# Patient Record
Sex: Male | Born: 1955 | Hispanic: No | Smoking: Former smoker
Health system: Southern US, Community
[De-identification: ages and names within clinical notes are randomized; demographics above are authoritative.]

## PROBLEM LIST (undated history)

## (undated) DIAGNOSIS — S83281A Other tear of lateral meniscus, current injury, right knee, initial encounter: Secondary | ICD-10-CM

## (undated) DIAGNOSIS — J45909 Unspecified asthma, uncomplicated: Secondary | ICD-10-CM

## (undated) DIAGNOSIS — R011 Cardiac murmur, unspecified: Secondary | ICD-10-CM

## (undated) DIAGNOSIS — J40 Bronchitis, not specified as acute or chronic: Secondary | ICD-10-CM

## (undated) HISTORY — DX: Cardiac murmur, unspecified: R01.1

## (undated) HISTORY — DX: Unspecified asthma, uncomplicated: J45.909

## (undated) HISTORY — PX: TUMOR REMOVAL: SHX12

## (undated) HISTORY — DX: Bronchitis, not specified as acute or chronic: J40

## (undated) HISTORY — DX: Other tear of lateral meniscus, current injury, right knee, initial encounter: S83.281A

---

## 2020-10-22 ENCOUNTER — Encounter (INDEPENDENT_AMBULATORY_CARE_PROVIDER_SITE_OTHER): Payer: Self-pay

## 2020-10-22 ENCOUNTER — Other Ambulatory Visit: Payer: Self-pay

## 2020-10-22 ENCOUNTER — Other Ambulatory Visit: Payer: Self-pay | Admitting: Internal Medicine

## 2020-10-22 ENCOUNTER — Telehealth: Payer: Self-pay | Admitting: Internal Medicine

## 2020-10-22 ENCOUNTER — Ambulatory Visit (INDEPENDENT_AMBULATORY_CARE_PROVIDER_SITE_OTHER): Payer: Managed Care, Other (non HMO)

## 2020-10-22 ENCOUNTER — Encounter: Payer: Self-pay | Admitting: Internal Medicine

## 2020-10-22 ENCOUNTER — Ambulatory Visit (INDEPENDENT_AMBULATORY_CARE_PROVIDER_SITE_OTHER): Payer: Managed Care, Other (non HMO) | Admitting: Internal Medicine

## 2020-10-22 VITALS — BP 128/86 | HR 69 | Temp 98.2°F | Ht 72.0 in | Wt 201.0 lb

## 2020-10-22 DIAGNOSIS — J418 Mixed simple and mucopurulent chronic bronchitis: Secondary | ICD-10-CM

## 2020-10-22 DIAGNOSIS — Z720 Tobacco use: Secondary | ICD-10-CM | POA: Insufficient documentation

## 2020-10-22 DIAGNOSIS — Z23 Encounter for immunization: Secondary | ICD-10-CM | POA: Diagnosis not present

## 2020-10-22 DIAGNOSIS — N5201 Erectile dysfunction due to arterial insufficiency: Secondary | ICD-10-CM

## 2020-10-22 DIAGNOSIS — M5412 Radiculopathy, cervical region: Secondary | ICD-10-CM

## 2020-10-22 DIAGNOSIS — R059 Cough, unspecified: Secondary | ICD-10-CM | POA: Diagnosis not present

## 2020-10-22 DIAGNOSIS — R0602 Shortness of breath: Secondary | ICD-10-CM

## 2020-10-22 DIAGNOSIS — M4802 Spinal stenosis, cervical region: Secondary | ICD-10-CM

## 2020-10-22 DIAGNOSIS — R972 Elevated prostate specific antigen [PSA]: Secondary | ICD-10-CM

## 2020-10-22 LAB — HEPATIC FUNCTION PANEL
ALT: 17 U/L (ref 0–53)
AST: 21 U/L (ref 0–37)
Albumin: 4.8 g/dL (ref 3.5–5.2)
Alkaline Phosphatase: 89 U/L (ref 39–117)
Bilirubin, Direct: 0.1 mg/dL (ref 0.0–0.3)
Total Bilirubin: 0.7 mg/dL (ref 0.2–1.2)
Total Protein: 7.9 g/dL (ref 6.0–8.3)

## 2020-10-22 LAB — LIPID PANEL
Cholesterol: 186 mg/dL (ref 0–200)
HDL: 57.6 mg/dL (ref >39.00)
LDL Cholesterol: 111 mg/dL — ABNORMAL HIGH (ref 0–99)
NonHDL: 128.79
Total CHOL/HDL Ratio: 3
Triglycerides: 89 mg/dL (ref 0.0–149.0)
VLDL: 17.8 mg/dL (ref 0.0–40.0)

## 2020-10-22 LAB — TESTOSTERONE TOTAL,FREE,BIO, MALES
Albumin: 4.7 g/dL (ref 3.6–5.1)
Sex Hormone Binding: 64 nmol/L (ref 22–77)
Testosterone, Bioavailable: 83.9 ng/dL — ABNORMAL LOW (ref 110.0–575.0)
Testosterone, Free: 39.1 pg/mL — ABNORMAL LOW (ref 46.0–224.0)
Testosterone: 525 ng/dL (ref 250–827)

## 2020-10-22 LAB — TSH: TSH: 0.49 u[IU]/mL (ref 0.35–5.50)

## 2020-10-22 LAB — CBC WITH DIFFERENTIAL/PLATELET
Basophils Absolute: 0.1 10*3/uL (ref 0.0–0.1)
Basophils Relative: 1.1 % (ref 0.0–3.0)
Eosinophils Absolute: 0.3 10*3/uL (ref 0.0–0.7)
Eosinophils Relative: 4.1 % (ref 0.0–5.0)
HCT: 45.3 % (ref 39.0–52.0)
Hemoglobin: 14.9 g/dL (ref 13.0–17.0)
Lymphocytes Relative: 35.7 % (ref 12.0–46.0)
Lymphs Abs: 2.6 10*3/uL (ref 0.7–4.0)
MCHC: 32.9 g/dL (ref 30.0–36.0)
MCV: 95.5 fl (ref 78.0–100.0)
Monocytes Absolute: 0.7 10*3/uL (ref 0.1–1.0)
Monocytes Relative: 10.3 % (ref 3.0–12.0)
Neutro Abs: 3.5 10*3/uL (ref 1.4–7.7)
Neutrophils Relative %: 48.8 % (ref 43.0–77.0)
Platelets: 258 10*3/uL (ref 150.0–400.0)
RBC: 4.75 Mil/uL (ref 4.22–5.81)
RDW: 13.5 % (ref 11.5–15.5)
WBC: 7.2 10*3/uL (ref 4.0–10.5)

## 2020-10-22 LAB — PSA: PSA: 7.37 ng/mL — ABNORMAL HIGH (ref 0.10–4.00)

## 2020-10-22 LAB — BASIC METABOLIC PANEL
BUN: 16 mg/dL (ref 6–23)
CO2: 27 mEq/L (ref 19–32)
Calcium: 9.9 mg/dL (ref 8.4–10.5)
Chloride: 103 mEq/L (ref 96–112)
Creatinine, Ser: 1.17 mg/dL (ref 0.40–1.50)
GFR: 65.65 mL/min (ref >60.00)
Glucose, Bld: 85 mg/dL (ref 70–99)
Potassium: 4.2 mEq/L (ref 3.5–5.1)
Sodium: 139 mEq/L (ref 135–145)

## 2020-10-22 MED ORDER — VARDENAFIL HCL 20 MG PO TABS: 20.0000 mg | ORAL_TABLET | Freq: Every day | ORAL | 2 refills | Status: DC | PRN

## 2020-10-22 MED ORDER — ALBUTEROL SULFATE HFA 108 (90 BASE) MCG/ACT IN AERS: 2.0000 | INHALATION_SPRAY | Freq: Four times a day (QID) | RESPIRATORY_TRACT | 3 refills | Status: DC | PRN

## 2020-10-22 MED ORDER — UMECLIDINIUM-VILANTEROL 62.5-25 MCG/INH IN AEPB: 1.0000 | INHALATION_SPRAY | Freq: Every day | RESPIRATORY_TRACT | 1 refills | Status: DC

## 2020-10-22 MED ORDER — TRELEGY ELLIPTA 100-62.5-25 MCG/INH IN AEPB: 1.0000 | INHALATION_SPRAY | Freq: Every day | RESPIRATORY_TRACT | 1 refills | Status: DC

## 2020-10-22 MED ORDER — STIOLTO RESPIMAT 2.5-2.5 MCG/ACT IN AERS: 2.0000 | INHALATION_SPRAY | Freq: Every day | RESPIRATORY_TRACT | 1 refills | Status: DC

## 2020-10-22 MED ORDER — BREZTRI AEROSPHERE 160-9-4.8 MCG/ACT IN AERO: 2.0000 | INHALATION_SPRAY | Freq: Two times a day (BID) | RESPIRATORY_TRACT | 1 refills | Status: DC

## 2020-10-22 NOTE — Telephone Encounter (Signed)
   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

## 2020-10-22 NOTE — Patient Instructions (Signed)

## 2020-10-22 NOTE — Progress Notes (Signed)
[facility]  Subjective:  Patient ID: Tyler Zuniga, male    DOB: 1955/05/16  Age: 65 y.o. MRN: [id number]  CC: Cough  This visit occurred during the SARS-CoV-2 public health emergency.  Safety protocols were in place, including screening questions prior to the visit, additional usage of staff PPE, and extensive cleaning of exam room while observing appropriate contact time as indicated for disinfecting solutions.    HPI Tyler Zuniga presents to establish.  He complains of a 5-week history of nontraumatic left-sided neck pain that radiates into his left upper extremity with left upper extremity numbness.  He denies weakness or incoordination.  He is controlling the pain with aspirin.  He has a chronic cough and shortness of breath.  He is using a family members asthma inhaler but he does not know what the inhaler is.  He gets significant symptom relief with the inhaler.  The cough is rarely productive of clear phlegm.  He denies hemoptysis, chest pain, weight loss, dyspnea on exertion, or dysphagia.  History Tyler Zuniga has a past medical history of Acute lateral meniscus tear of right knee, Asthma, Bronchitis, and Heart murmur.   He has a past surgical history that includes Tumor removal.   His family history includes Alcohol abuse in his father; Arthritis in his maternal grandmother and mother; COPD in his brother and father; Cancer in his maternal grandmother; Drug abuse in his brother; Heart attack in his maternal grandfather; Heart disease in his brother; Stroke in his mother.He reports that he has been smoking cigarettes. He has a 80.00 pack-year smoking history. He has never used smokeless tobacco. He reports that he does not drink alcohol and does not use drugs.  No outpatient medications prior to visit.   No facility-administered medications prior to visit.    ROS Review of Systems  Constitutional:  Negative for chills, diaphoresis, fatigue and fever.  HENT: Negative.     Respiratory:  Positive for cough and shortness of breath. Negative for chest tightness and wheezing.   Cardiovascular:  Negative for chest pain, palpitations and leg swelling.  Gastrointestinal:  Negative for abdominal pain, constipation, diarrhea, nausea and vomiting.  Genitourinary: Negative.  Negative for difficulty urinating and dysuria.       ++ED  Musculoskeletal:  Positive for neck pain. Negative for back pain and neck stiffness.  Skin: Negative.   Neurological:  Positive for numbness. Negative for dizziness, weakness and light-headedness.  Hematological:  Negative for adenopathy. Does not bruise/bleed easily.  Psychiatric/Behavioral: Negative.     Objective:  BP 128/86 (BP Location: Left Arm, Patient Position: Sitting, Cuff Size: Large)   Pulse 69   Temp 98.2 F (36.8 C) (Oral)   Ht 6' (1.829 m)   Wt 201 lb (91.2 kg)   SpO2 96%   BMI 27.26 kg/m   Physical Exam Vitals reviewed.  HENT:     Nose: Nose normal.     Mouth/Throat:     Mouth: Mucous membranes are moist.  Eyes:     Conjunctiva/sclera: Conjunctivae normal.  Cardiovascular:     Rate and Rhythm: Normal rate and regular rhythm.     Pulses:          Carotid pulses are 1+ on the right side and 1+ on the left side.      Radial pulses are 1+ on the right side and 1+ on the left side.       Femoral pulses are 1+ on the right side and 1+ on the left side.  Popliteal pulses are 1+ on the right side and 1+ on the left side.       Dorsalis pedis pulses are 1+ on the right side and 1+ on the left side.       Posterior tibial pulses are 1+ on the right side and 1+ on the left side.     Comments: EKG- NSR, 60 bpm Flat T wave in I No LVH or Q waves  Pulmonary:     Effort: Pulmonary effort is normal.     Breath sounds: No stridor. No wheezing, rhonchi or rales.  Abdominal:     General: Abdomen is flat.     Palpations: There is no mass.     Tenderness: There is no abdominal tenderness. There is no guarding.      Hernia: No hernia is present.  Musculoskeletal:        General: Normal range of motion.     Cervical back: Neck supple.     Right lower leg: No edema.     Left lower leg: No edema.  Lymphadenopathy:     Cervical: No cervical adenopathy.  Skin:    General: Skin is warm and dry.  Neurological:     General: No focal deficit present.     Mental Status: He is alert.  Psychiatric:        Mood and Affect: Mood normal.        Behavior: Behavior normal.   Lab Results  Component Value Date   WBC 7.2 10/22/2020   HGB 14.9 10/22/2020   HCT 45.3 10/22/2020   PLT 258.0 10/22/2020   GLUCOSE 85 10/22/2020   CHOL 186 10/22/2020   TRIG 89.0 10/22/2020   HDL 57.60 10/22/2020   LDLCALC 111 (H) 10/22/2020   ALT 17 10/22/2020   AST 21 10/22/2020   NA 139 10/22/2020   K 4.2 10/22/2020   CL 103 10/22/2020   CREATININE 1.17 10/22/2020   BUN 16 10/22/2020   CO2 27 10/22/2020   TSH 0.49 10/22/2020   PSA 7.37 (H) 10/22/2020    DG Chest 2 View  Result Date: 10/23/2020 CLINICAL DATA:  chronic cough, tobacco abuse EXAM: CHEST - 2 VIEW COMPARISON:  None. FINDINGS: The heart size and mediastinal contours are within normal limits. Tortuous aorta. Both lungs are clear. No visible pleural effusions or pneumothorax. No acute osseous abnormality. IMPRESSION: No active cardiopulmonary disease. Electronically Signed   By: Feliberto HartsFrederick S Jaydrian Corpening MD   On: 10/23/2020 08:05   DG Cervical Spine Complete  Result Date: 10/23/2020 CLINICAL DATA:  Left neck pain 5 weeks.  Left arm pain. EXAM: CERVICAL SPINE - COMPLETE 4+ VIEW COMPARISON:  None. FINDINGS: Normal alignment. No fracture or mass. Prevertebral soft tissues normal Mild disc degeneration and spurring throughout the cervical spine. Moderate right foraminal narrowing C3-4 and C4-5 due to spurring. Mild right foraminal narrowing C5-6. Mild left foraminal narrowing C3-4, C4-5, C5-6, C6-7 due to spurring. IMPRESSION: Cervical spondylosis with foraminal narrowing  bilaterally. No acute skeletal abnormality Electronically Signed   By: Marlan Palauharles  Clark M.D.   On: 10/23/2020 13:25     Assessment & Plan:   [name] was seen today for cough.  Diagnoses and all orders for this visit:  Cough- His chest x-ray is negative for mass or infiltrate. -     DG Chest 2 View; Future  Radiculitis of left cervical region -     DG Cervical Spine Complete; Future -     Ambulatory referral to Pain Clinic  Mixed  simple and mucopurulent chronic bronchitis (HCC)- I recommended that he start using a LAMA/LABA inhaler for this. -     Discontinue: Budeson-Glycopyrrol-Formoterol (BREZTRI AEROSPHERE) 160-9-4.8 MCG/ACT AERO; Inhale 2 puffs into the lungs in the morning and at bedtime. -     albuterol (VENTOLIN HFA) 108 (90 Base) MCG/ACT inhaler; Inhale 2 puffs into the lungs every 6 (six) hours as needed for wheezing or shortness of breath. -     CBC with Differential/Platelet; Future -     CBC with Differential/Platelet -     Discontinue: Fluticasone-Umeclidin-Vilant (TRELEGY ELLIPTA) 100-62.5-25 MCG/INH AEPB; Inhale 1 puff into the lungs daily. -     Discontinue: Tiotropium Bromide-Olodaterol (STIOLTO RESPIMAT) 2.5-2.5 MCG/ACT AERS; Inhale 2 puffs into the lungs daily. -     umeclidinium-vilanterol (ANORO ELLIPTA) 62.5-25 MCG/INH AEPB; Inhale 1 puff into the lungs daily at 6 (six) AM.  Tobacco abuse  SOB (shortness of breath)- I think this is related to the COPD.  His EKG and labs are reassuring. -     CBC with Differential/Platelet; Future -     Basic metabolic panel; Future -     Hepatic function panel; Future -     EKG 12-Lead -     Hepatic function panel -     Basic metabolic panel -     CBC with Differential/Platelet  Need for vaccination -     Pneumococcal conjugate vaccine 20-valent  Erectile dysfunction due to arterial insufficiency -     Lipid panel; Future -     Testosterone Total,Free,Bio, Males; Future -     TSH; Future -     PSA; Future -     PSA -      TSH -     Testosterone Total,Free,Bio, Males -     Lipid panel -     vardenafil (LEVITRA) 20 MG tablet; Take 1 tablet (20 mg total) by mouth daily as needed for erectile dysfunction.  PSA elevation -     Ambulatory referral to Urology  Foraminal stenosis of cervical region -     Ambulatory referral to Pain Clinic  I have discontinued Normand Sloop Delsanto's Breztri Aerosphere, Trelegy Ellipta, and Stiolto Respimat. I am also having him start on albuterol, umeclidinium-vilanterol, and vardenafil.  Meds ordered this encounter  Medications   DISCONTD: Budeson-Glycopyrrol-Formoterol (BREZTRI AEROSPHERE) 160-9-4.8 MCG/ACT AERO    Sig: Inhale 2 puffs into the lungs in the morning and at bedtime.    Dispense:  32.1 g    Refill:  1   albuterol (VENTOLIN HFA) 108 (90 Base) MCG/ACT inhaler    Sig: Inhale 2 puffs into the lungs every 6 (six) hours as needed for wheezing or shortness of breath.    Dispense:  18 g    Refill:  3   DISCONTD: Fluticasone-Umeclidin-Vilant (TRELEGY ELLIPTA) 100-62.5-25 MCG/INH AEPB    Sig: Inhale 1 puff into the lungs daily.    Dispense:  120 each    Refill:  1   DISCONTD: Tiotropium Bromide-Olodaterol (STIOLTO RESPIMAT) 2.5-2.5 MCG/ACT AERS    Sig: Inhale 2 puffs into the lungs daily.    Dispense:  12 g    Refill:  1   umeclidinium-vilanterol (ANORO ELLIPTA) 62.5-25 MCG/INH AEPB    Sig: Inhale 1 puff into the lungs daily at 6 (six) AM.    Dispense:  120 each    Refill:  1   vardenafil (LEVITRA) 20 MG tablet    Sig: Take 1 tablet (20 mg total) by mouth daily as  needed for erectile dysfunction.    Dispense:  5 tablet    Refill:  2   DG Chest 2 View  Result Date: 10/23/2020 CLINICAL DATA:  chronic cough, tobacco abuse EXAM: CHEST - 2 VIEW COMPARISON:  None. FINDINGS: The heart size and mediastinal contours are within normal limits. Tortuous aorta. Both lungs are clear. No visible pleural effusions or pneumothorax. No acute osseous abnormality. IMPRESSION: No  active cardiopulmonary disease. Electronically Signed   By: Feliberto Harts MD   On: 10/23/2020 08:05   DG Cervical Spine Complete  Result Date: 10/23/2020 CLINICAL DATA:  Left neck pain 5 weeks.  Left arm pain. EXAM: CERVICAL SPINE - COMPLETE 4+ VIEW COMPARISON:  None. FINDINGS: Normal alignment. No fracture or mass. Prevertebral soft tissues normal Mild disc degeneration and spurring throughout the cervical spine. Moderate right foraminal narrowing C3-4 and C4-5 due to spurring. Mild right foraminal narrowing C5-6. Mild left foraminal narrowing C3-4, C4-5, C5-6, C6-7 due to spurring. IMPRESSION: Cervical spondylosis with foraminal narrowing bilaterally. No acute skeletal abnormality Electronically Signed   By: Marlan Palau M.D.   On: 10/23/2020 13:25      Follow-up: Return in about 4 weeks (around 11/19/2020).  Sanda Linger, MD

## 2020-10-23 ENCOUNTER — Encounter: Payer: Self-pay | Admitting: Internal Medicine

## 2020-10-23 DIAGNOSIS — M4802 Spinal stenosis, cervical region: Secondary | ICD-10-CM | POA: Insufficient documentation

## 2020-10-23 DIAGNOSIS — Z23 Encounter for immunization: Secondary | ICD-10-CM | POA: Insufficient documentation

## 2020-11-12 ENCOUNTER — Encounter: Payer: Self-pay | Admitting: Physical Medicine and Rehabilitation

## 2020-12-01 ENCOUNTER — Other Ambulatory Visit: Payer: Self-pay

## 2020-12-02 ENCOUNTER — Ambulatory Visit (INDEPENDENT_AMBULATORY_CARE_PROVIDER_SITE_OTHER): Payer: Managed Care, Other (non HMO) | Admitting: Internal Medicine

## 2020-12-02 ENCOUNTER — Encounter: Payer: Self-pay | Admitting: Internal Medicine

## 2020-12-02 VITALS — BP 132/82 | HR 65 | Temp 98.6°F | Resp 16 | Ht 72.0 in | Wt 208.0 lb

## 2020-12-02 DIAGNOSIS — Z23 Encounter for immunization: Secondary | ICD-10-CM | POA: Diagnosis not present

## 2020-12-02 DIAGNOSIS — N5201 Erectile dysfunction due to arterial insufficiency: Secondary | ICD-10-CM | POA: Diagnosis not present

## 2020-12-02 DIAGNOSIS — J418 Mixed simple and mucopurulent chronic bronchitis: Secondary | ICD-10-CM

## 2020-12-02 DIAGNOSIS — Z720 Tobacco use: Secondary | ICD-10-CM | POA: Diagnosis not present

## 2020-12-02 MED ORDER — VARDENAFIL HCL 20 MG PO TABS: 20.0000 mg | ORAL_TABLET | Freq: Every day | ORAL | 2 refills | Status: DC | PRN

## 2020-12-02 MED ORDER — UMECLIDINIUM-VILANTEROL 62.5-25 MCG/INH IN AEPB: 1.0000 | INHALATION_SPRAY | Freq: Every day | RESPIRATORY_TRACT | 1 refills | Status: DC

## 2020-12-02 NOTE — Progress Notes (Signed)
Subjective:  Patient ID: Tyler Zuniga, male    DOB: [birth year]  Age: 65 y.o. MRN: [id number]  CC: COPD  This visit occurred during the SARS-CoV-2 public health emergency.  Safety protocols were in place, including screening questions prior to the visit, additional usage of staff PPE, and extensive cleaning of exam room while observing appropriate contact time as indicated for disinfecting solutions.    HPI Charon Akamine presents for f/up -  His breathing has improved with the LAMA/LABA inhaler.  He wants me to send the prescription to his pharmacy benefit manager.  He denies chest pain, shortness of breath, wheezing, or cough.  Outpatient Medications Prior to Visit  Medication Sig Dispense Refill   albuterol (VENTOLIN HFA) 108 (90 Base) MCG/ACT inhaler Inhale 2 puffs into the lungs every 6 (six) hours as needed for wheezing or shortness of breath. 18 g 3   umeclidinium-vilanterol (ANORO ELLIPTA) 62.5-25 MCG/INH AEPB Inhale 1 puff into the lungs daily at 6 (six) AM. 120 each 1   vardenafil (LEVITRA) 20 MG tablet Take 1 tablet (20 mg total) by mouth daily as needed for erectile dysfunction. 5 tablet 2   No facility-administered medications prior to visit.    ROS Review of Systems  Constitutional:  Negative for diaphoresis and fatigue.  HENT: Negative.    Eyes: Negative.   Respiratory: Negative.  Negative for cough, chest tightness, shortness of breath and wheezing.   Cardiovascular:  Negative for chest pain, palpitations and leg swelling.  Gastrointestinal:  Negative for abdominal pain, diarrhea, nausea and vomiting.  Endocrine: Negative.   Genitourinary: Negative.   Musculoskeletal:  Negative for arthralgias and myalgias.  Skin: Negative.   Neurological:  Negative for dizziness, weakness and light-headedness.  Hematological:  Negative for adenopathy. Does not bruise/bleed easily.  Psychiatric/Behavioral: Negative.     Objective:  BP 132/82 (BP Location: Right Arm, Patient  Position: Sitting, Cuff Size: Large)   Pulse 65   Temp 98.6 F (37 C) (Oral)   Resp 16   Ht 6' (1.829 m)   Wt 208 lb (94.3 kg)   SpO2 97%   BMI 28.21 kg/m   BP Readings from Last 3 Encounters:  12/02/20 132/82  10/22/20 128/86    Wt Readings from Last 3 Encounters:  12/02/20 208 lb (94.3 kg)  10/22/20 201 lb (91.2 kg)    Physical Exam Vitals reviewed.  HENT:     Nose: Nose normal.     Mouth/Throat:     Mouth: Mucous membranes are moist.  Eyes:     General: No scleral icterus.    Conjunctiva/sclera: Conjunctivae normal.  Cardiovascular:     Rate and Rhythm: Normal rate and regular rhythm.     Heart sounds: No murmur heard. Pulmonary:     Effort: Pulmonary effort is normal.     Breath sounds: No stridor. No wheezing, rhonchi or rales.  Abdominal:     General: Abdomen is flat. Bowel sounds are normal. There is no distension.     Palpations: Abdomen is soft. There is no hepatomegaly, splenomegaly or mass.     Tenderness: There is no abdominal tenderness.  Musculoskeletal:        General: Normal range of motion.     Cervical back: Neck supple.     Right lower leg: No edema.     Left lower leg: No edema.  Lymphadenopathy:     Cervical: No cervical adenopathy.  Skin:    General: Skin is warm and dry.  Neurological:  General: No focal deficit present.     Mental Status: He is alert.    Lab Results  Component Value Date   WBC 7.2 10/22/2020   HGB 14.9 10/22/2020   HCT 45.3 10/22/2020   PLT 258.0 10/22/2020   GLUCOSE 85 10/22/2020   CHOL 186 10/22/2020   TRIG 89.0 10/22/2020   HDL 57.60 10/22/2020   LDLCALC 111 (H) 10/22/2020   ALT 17 10/22/2020   AST 21 10/22/2020   NA 139 10/22/2020   K 4.2 10/22/2020   CL 103 10/22/2020   CREATININE 1.17 10/22/2020   BUN 16 10/22/2020   CO2 27 10/22/2020   TSH 0.49 10/22/2020   PSA 7.37 (H) 10/22/2020    Patient was never admitted.  Assessment & Plan:   Melissa was seen today for copd.  Diagnoses and all  orders for this visit:  Flu vaccine need -     Flu Vaccine QUAD High Dose(Fluad)  Mixed simple and mucopurulent chronic bronchitis (HCC)- Will continue the LAMA/LABA inhaler. -     umeclidinium-vilanterol (ANORO ELLIPTA) 62.5-25 MCG/INH AEPB; Inhale 1 puff into the lungs daily at 6 (six) AM.  Tobacco abuse -     Ambulatory Referral for Lung Cancer Scre  Erectile dysfunction due to arterial insufficiency -     vardenafil (LEVITRA) 20 MG tablet; Take 1 tablet (20 mg total) by mouth daily as needed for erectile dysfunction.  Other orders -     Tdap vaccine greater than or equal to 7yo IM  I am having Oluwatobi Visser maintain his albuterol, umeclidinium-vilanterol, and vardenafil.  Meds ordered this encounter  Medications   umeclidinium-vilanterol (ANORO ELLIPTA) 62.5-25 MCG/INH AEPB    Sig: Inhale 1 puff into the lungs daily at 6 (six) AM.    Dispense:  120 each    Refill:  1   vardenafil (LEVITRA) 20 MG tablet    Sig: Take 1 tablet (20 mg total) by mouth daily as needed for erectile dysfunction.    Dispense:  15 tablet    Refill:  2      Follow-up: Return in about 6 months (around 06/01/2021).  Sanda Linger, MD

## 2020-12-02 NOTE — Patient Instructions (Signed)
Chronic Obstructive Pulmonary Disease °Chronic obstructive pulmonary disease (COPD) is a long-term (chronic) condition that affects the lungs. COPD is a general term that can be used to describe many different lung problems that cause lung inflammation and limit airflow, including chronic bronchitis and emphysema. °If you have COPD, your lung function will probably never return to normal. In most cases, it gets worse over time. However, there are steps you can take to slow the progression of the disease and improve your quality of life. °What are the causes? °This condition may be caused by: °Smoking. This is the most common cause. °Certain genes passed down through families. °What increases the risk? °The following factors may make you more likely to develop this condition: °Being exposed to secondhand smoke from cigarettes, pipes, or cigars. °Being exposed to chemicals and other irritants, such as fumes and dust in the work environment. °Having chronic lung conditions or infections. °What are the signs or symptoms? °Symptoms of this condition include: °Shortness of breath, especially during physical activity. °Chronic cough with a large amount of thick mucus. Sometimes, the cough may not have any mucus (dry cough). °Wheezing and rapid breathing. °Gray or bluish discoloration (cyanosis) of the skin, especially in the fingers, toes, or lips. °Feeling tired (fatigue). °Weight loss. °Chest tightness. °Frequent infections. °Episodes when breathing symptoms become much worse (exacerbations). °At the later stages of this disease, you may have swelling in the ankles, feet, or legs. °How is this diagnosed? °This condition is diagnosed based on: °Your medical history. °A physical exam. °You may also have tests, including: °Lung (pulmonary) function tests. This may include a spirometry test, which measures your ability to exhale properly. °Chest X-ray. °CT scan. °Blood tests. °How is this treated? °This condition may be  treated with: °Medicines. These may include inhaled rescue medicines to treat acute exacerbations as well as medicines that you take long-term (maintenance medicines) to prevent flare-ups of COPD. °Bronchodilators help treat COPD by dilating the airways to allow increased airflow and make your breathing more comfortable. °Steroids can reduce airway inflammation and help prevent exacerbations. °Smoking cessation. If you smoke, your health care provider may ask you to quit, and may also recommend therapy or replacement products to help you quit. °Pulmonary rehabilitation. This may involve working with a team of health care providers and specialists, such as respiratory, occupational, and physical therapists. °Exercise and physical activity. These are beneficial for nearly all people with COPD. °Nutrition therapy to gain weight, if you are underweight. °Oxygen. Supplemental oxygen therapy is only helpful if you have a low oxygen level in your blood (hypoxemia). °Lung surgery or transplant. °Palliative care. This is to help people with COPD feel comfortable when treatment is no longer working. °Follow these instructions at home: °Medicines °Take over-the-counter and prescription medicines only as told by your health care provider. This includes inhaled medicines and pills. °Talk to your health care provider before taking any cough or allergy medicines. You may need to avoid certain medicines that dry out your airways. °Lifestyle °If you smoke, the most important thing that you can do is to stop smoking. Continuing to smoke will cause the disease to progress faster. °Do not use any products that contain nicotine or tobacco. These products include cigarettes, chewing tobacco, and vaping devices, such as e-cigarettes. If you need help quitting, ask your health care provider. °Avoid exposure to things that irritate your lungs, such as smoke, chemicals, and fumes. °Stay active, but balance activity with periods of rest.  Exercise and physical   activity will help you maintain your ability to do things you want to do. °Learn and use relaxation techniques to manage stress and to control your breathing. °Get the right amount of sleep and get quality sleep. Most adults need 7 or more hours per night. °Eat healthy foods. Eating smaller, more frequent meals and resting before meals may help you maintain your strength. °Controlled breathing °Learn and use controlled breathing techniques as directed by your health care provider. Controlled breathing techniques include: °Pursed lip breathing. Start by breathing in (inhaling) through your nose for 1 second. Then, purse your lips as if you were going to whistle and breathe out (exhale) through the pursed lips for 2 seconds. °Diaphragmatic breathing. Start by putting one hand on your abdomen just above your waist. Inhale slowly through your nose. The hand on your abdomen should move out. Then purse your lips and exhale slowly. You should be able to feel the hand on your abdomen moving in as you exhale. ° °Controlled coughing °Learn and use controlled coughing to clear mucus from your lungs. Controlled coughing is a series of short, progressive coughs. The steps of controlled coughing are: °Lean your head slightly forward. °Breathe in deeply using diaphragmatic breathing. °Try to hold your breath for 3 seconds. °Keep your mouth slightly open while coughing twice. °Spit any mucus out into a tissue. °Rest and repeat the steps once or twice as needed. °General instructions °Make sure you receive all the vaccines that your health care provider recommends, especially the pneumococcal and influenza vaccines. Preventing infection and hospitalization is very important when you have COPD. °Drink enough fluid to keep your urine pale yellow, unless you have a medical condition that requires fluid restriction. °Use oxygen therapy and pulmonary rehabilitation if told by your health care provider. If you  require home oxygen therapy, ask your health care provider whether you should purchase a pulse oximeter to measure your oxygen level at home. °Work with your health care provider to develop a COPD action plan. This will help you know what steps to take if your condition gets worse. °Keep other chronic health conditions under control as told by your health care provider. °Avoid extreme temperature and humidity changes. °Avoid contact with people who have an illness that spreads from person to person (is contagious), such as viral infections or pneumonia. °Keep all follow-up visits. This is important. °Contact a health care provider if: °You are coughing up more mucus than usual. °There is a change in the color or thickness of your mucus. °Your breathing is more labored than usual. °Your breathing is faster than usual. °You have difficulty sleeping. °You need to use your rescue medicines or inhalers more often than expected. °You have trouble doing routine activities such as getting dressed or walking around the house. °Get help right away if: °You have shortness of breath while you are resting. °You have shortness of breath that prevents you from: °Being able to talk. °Performing your usual physical activities. °You have chest pain lasting longer than 5 minutes. °Your skin color is more blue (cyanotic) than usual. °You measure low oxygen saturations for longer than 5 minutes with a pulse oximeter. °You have a fever. °You feel too tired to breathe normally. °These symptoms may represent a serious problem that is an emergency. Do not wait to see if the symptoms will go away. Get medical help right away. Call your local emergency services (911 in the [place] Do not drive yourself to the hospital. °Summary °Chronic obstructive pulmonary   disease (COPD) is a long-term (chronic) condition that affects the lungs. °Your lung function will probably never return to normal. In most cases, it gets worse over time. However, there  are steps you can take to slow the progression of the disease and improve your quality of life. °Treatment for COPD may include taking medicines, quitting smoking, pulmonary rehabilitation, and changes to diet and exercise. As the disease progresses, you may need oxygen therapy, a lung transplant, or palliative care. °To help manage your condition, do not smoke, avoid exposure to things that irritate your lungs, stay up to date on all vaccines, and follow your health care provider's instructions for taking medicines. °This information is not intended to replace advice given to you by your health care provider. Make sure you discuss any questions you have with your health care provider. °Document Revised: 01/07/2020 Document Reviewed: 01/07/2020 °Elsevier Patient Education © 2022 [employer] ° °

## 2020-12-10 ENCOUNTER — Telehealth: Payer: Self-pay | Admitting: Internal Medicine

## 2020-12-10 DIAGNOSIS — J418 Mixed simple and mucopurulent chronic bronchitis: Secondary | ICD-10-CM

## 2020-12-10 MED ORDER — ALBUTEROL SULFATE HFA 108 (90 BASE) MCG/ACT IN AERS: 2.0000 | INHALATION_SPRAY | Freq: Four times a day (QID) | RESPIRATORY_TRACT | 3 refills | Status: DC | PRN

## 2020-12-10 NOTE — Telephone Encounter (Signed)
1.Medication Requested: albuterol (VENTOLIN HFA) 108 (90 Base) MCG/ACT inhaler  2. Pharmacy (Name, Street, [place]): [facility] - [place], [place] - [address] AT [facility]  Phone:  [phone] Fax:  [phone]   3. On Med List: yes  4. Last Visit with PCP: 09.21.22  5. Next visit date with PCP: n/a  Patient says please discontinue his rx for  umeclidinium-vilanterol (ANORO ELLIPTA) 62.5-25 MCG/INH AEPB bc he says the inhaler makes him feel like he has something stuck in his throat & he doesn't like using it    Agent: Please be advised that RX refills may take up to 3 business days. We ask that you follow-up with your pharmacy.

## 2021-01-06 ENCOUNTER — Encounter
Payer: Managed Care, Other (non HMO) | Attending: Physical Medicine and Rehabilitation | Admitting: Physical Medicine and Rehabilitation

## 2021-01-06 ENCOUNTER — Other Ambulatory Visit: Payer: Self-pay

## 2021-01-06 ENCOUNTER — Encounter: Payer: Self-pay | Admitting: Physical Medicine and Rehabilitation

## 2021-01-06 VITALS — BP 162/95 | HR 67 | Ht 72.0 in | Wt 212.2 lb

## 2021-01-06 DIAGNOSIS — M4802 Spinal stenosis, cervical region: Secondary | ICD-10-CM | POA: Insufficient documentation

## 2021-01-06 DIAGNOSIS — I10 Essential (primary) hypertension: Secondary | ICD-10-CM | POA: Insufficient documentation

## 2021-01-06 MED ORDER — DULOXETINE HCL 20 MG PO CPEP: 20.0000 mg | ORAL_CAPSULE | Freq: Every day | ORAL | 3 refills | Status: DC

## 2021-01-06 NOTE — Addendum Note (Signed)
Addended by: Horton Chin on: 01/06/2021 03:34 PM   Modules accepted: Orders

## 2021-01-06 NOTE — Patient Instructions (Signed)
Foods that may reduce pain: 1) Ginger (especially studied for arthritis)- reduce leukotriene production to decrease inflammation 2) Blueberries- high in phytonutrients that decrease inflammation 3) Salmon- marine omega-3s reduce joint swelling and pain 4) Pumpkin seeds- reduce inflammation 5) dark chocolate- reduces inflammation 6) turmeric- reduces inflammation 7) tart cherries - reduce pain and stiffness 8) extra virgin olive oil - its compound olecanthal helps to block prostaglandins  9) chili peppers- can be eaten or applied topically via capsaicin 10) mint- helpful for headache, muscle aches, joint pain, and itching 11) garlic- reduces inflammation  Link to further information on diet for chronic pain: [email or web address]   HTN: -BP is 162/95 today.  -Advised checking BP daily at home and logging results to bring into follow-up appointment with her PCP and myself. -Reviewed BP meds today.  -Advised regarding healthy foods that can help lower blood pressure and provided with a list: 1) citrus foods- high in vitamins and minerals 2) salmon and other fatty fish - reduces inflammation and oxylipins 3) swiss chard (leafy green)- high level of nitrates 4) pumpkin seeds- one of the best natural sources of magnesium 5) Beans and lentils- high in fiber, magnesium, and potassium 6) Berries- high in flavonoids 7) Amaranth (whole grain, can be cooked similarly to rice and oats)- high in magnesium and fiber 8) Pistachios- even more effective at reducing BP than other nuts 9) Carrots- high in phenolic compounds that relax blood vessels and reduce inflammation 10) Celery- contain phthalides that relax tissues of arterial walls 11) Tomatoes- can also improve cholesterol and reduce risk of heart disease 12) Broccoli- good source of magnesium, calcium, and potassium 13) Greek yogurt: high in potassium and calcium 14) Herbs  and spices: Celery seed, cilantro, saffron, lemongrass, black cumin, ginseng, cinnamon, cardamom, sweet basil, and ginger 15) Chia and flax seeds- also help to lower cholesterol and blood sugar 16) Beets- high levels of nitrates that relax blood vessels  17) spinach and bananas- high in potassium  -Provided lise of supplements that can help with hypertension:  1) magnesium: one high quality brand is Bioptemizers since it contains all 7 types of magnesium, otherwise over the counter magnesium gluconate 400mg  is a good option 2) B vitamins 3) vitamin D 4) potassium 5) CoQ10 6) L-arginine 7) Vitamin C 8) Beetroot -Educated that goal BP is 120/80. -Made goal to incorporate some of the above foods into diet.

## 2021-01-06 NOTE — Progress Notes (Signed)
Subjective:    Patient ID: Tyler Zuniga, male    DOB: 23-Mar-1955, 65 y.o.   MRN: [id number]  HPI Tyler Zuniga is a 65 year old man who presents for cervical foraminal stenosis. -average pain is 7/10 -pain is burning, stabbing, tingling, and aching.  -started when he went on vacation -worse with movement -numbness extends down left arm -fingers never stop tingling.  -he has been using ibuprofen -his wife benefited from injection  BP today is 162/95   Pain Inventory Average Pain 7 Pain Right Now 7 My pain is burning, stabbing, tingling, and aching  In the last 24 hours, has pain interfered with the following? General activity 8 Relation with others 8 Enjoyment of life 10 What TIME of day is your pain at its worst? morning , daytime, evening, and night Sleep (in general) Poor  Pain is worse with: bending and some activites Pain improves with:  nothing improves pain , takes ibuprofen with no relief Relief from Meds: 0  walk without assistance ability to climb steps?  no do you drive?  no  employed # of hrs/week 70  weakness numbness tingling dizziness confusion depression anxiety  Any changes since last visit?  no  Any changes since last visit?  no    Family History  Problem Relation Age of Onset   Stroke Mother    Arthritis Mother    COPD Father    Alcohol abuse Father    Heart disease Brother    Drug abuse Brother    COPD Brother    Cancer Maternal Grandmother    Arthritis Maternal Grandmother    Heart attack Maternal Grandfather    Social History   Socioeconomic History   Marital status: Married    Spouse name: Not on file   Number of children: Not on file   Years of education: Not on file   Highest education level: Not on file  Occupational History   Not on file  Tobacco Use   Smoking status: Every Day    Packs/day: 2.00    Years: 40.00    Pack years: 80.00    Types: Cigarettes   Smokeless tobacco: Never  Vaping Use   Vaping Use:  Never used  Substance and Sexual Activity   Alcohol use: Never   Drug use: Never   Sexual activity: Yes    Partners: Female  Other Topics Concern   Not on file  Social History Narrative   Not on file   Social Determinants of Health   Financial Resource Strain: Not on file  Food Insecurity: Not on file  Transportation Needs: Not on file  Physical Activity: Not on file  Stress: Not on file  Social Connections: Not on file   Past Surgical History:  Procedure Laterality Date   TUMOR REMOVAL     Past Medical History:  Diagnosis Date   Acute lateral meniscus tear of right knee    Asthma    Bronchitis    Heart murmur    BP (!) 162/95   Pulse 67   Ht 6' (1.829 m)   Wt 212 lb 3.2 oz (96.3 kg)   SpO2 97%   BMI 28.78 kg/m   Opioid Risk Score:   Fall Risk Score:  `1  Depression screen PHQ 2/9  Depression screen [facility] 2/9 01/06/2021 10/22/2020  Decreased Interest 3 0  Down, Depressed, Hopeless 3 0  PHQ - 2 Score 6 0  Altered sleeping 3 -  Tired, decreased energy 3 -  Change in appetite 0 -  Feeling bad or failure about yourself  0 -  Trouble concentrating 3 -  Moving slowly or fidgety/restless 3 -  Suicidal thoughts 0 -  PHQ-9 Score 18 -     Review of Systems  Constitutional: Negative.   HENT: Negative.    Eyes: Negative.   Respiratory: Negative.    Cardiovascular: Negative.   Gastrointestinal: Negative.   Endocrine: Negative.   Genitourinary: Negative.   Musculoskeletal: Negative.   Skin: Negative.   Allergic/Immunologic: Negative.   Neurological:  Positive for dizziness, weakness and numbness.  Hematological: Negative.   Psychiatric/Behavioral:  Positive for dysphoric mood. The patient is nervous/anxious.       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  -Discussed benefits of exercise in reducing pain. -Provided with a pain relief journal and discussed that it contains foods and lifestyle tips to naturally help to improve pain. Discussed that these lifestyle strategies are also very good for health unlike some medications which can have negative side effects. Discussed that the act of keeping a journal can be therapeutic and helpful to realize patterns what helps to trigger and alleviate pain.   Ordered MRI of spine given radicular symptoms, associated dizziness.  -Discussed following foods that may reduce pain: 1) Ginger (especially studied for arthritis)- reduce leukotriene production to decrease inflammation 2) Blueberries- high in phytonutrients that decrease inflammation 3) Salmon- marine omega-3s reduce joint swelling and pain 4) Pumpkin seeds- reduce inflammation 5) dark chocolate- reduces inflammation 6) turmeric- reduces inflammation 7) tart cherries - reduce pain and stiffness 8) extra virgin olive oil - its compound olecanthal helps to block prostaglandins  9) chili peppers- can be eaten or applied topically via capsaicin 10) mint- helpful for headache, muscle aches, joint pain, and itching 11) garlic- reduces inflammation  Link to further information on diet for chronic pain: [email or web address]   2) HTN: -BP is 162/95 today.  -Advised checking BP daily at home and logging results to bring into follow-up appointment with her PCP and myself. -Reviewed BP meds today.  -Advised regarding healthy foods that can help lower blood pressure and provided with a  list: 1) citrus foods- high in vitamins and minerals 2) salmon and other fatty fish - reduces inflammation and oxylipins 3) swiss chard (leafy green)- high level of nitrates 4) pumpkin seeds- one of the best natural sources of magnesium 5) Beans and lentils- high in fiber, magnesium, and potassium 6) Berries- high in flavonoids 7) Amaranth (whole grain, can be cooked similarly to rice and oats)- high in magnesium and fiber 8) Pistachios- even more effective at reducing BP than other nuts 9) Carrots- high in phenolic compounds that relax blood vessels and reduce inflammation 10) Celery- contain phthalides that relax tissues of arterial walls 11) Tomatoes- can also improve cholesterol and reduce risk of heart disease 12) Broccoli- good source of magnesium, calcium, and potassium 13) Greek yogurt: high in potassium and calcium 14) Herbs and spices: Celery seed, cilantro, saffron, lemongrass, black cumin, ginseng, cinnamon, cardamom, sweet basil, and ginger 15)  Chia and flax seeds- also help to lower cholesterol and blood sugar 16) Beets- high levels of nitrates that relax blood vessels  17) spinach and bananas- high in potassium  -Provided lise of supplements that can help with hypertension:  1) magnesium: one high quality brand is Bioptemizers since it contains all 7 types of magnesium, otherwise over the counter magnesium gluconate 400mg  is a good option 2) B vitamins 3) vitamin D 4) potassium 5) CoQ10 6) L-arginine 7) Vitamin C 8) Beetroot -Educated that goal BP is 120/80. -Made goal to incorporate some of the above foods into diet.

## 2021-01-07 ENCOUNTER — Telehealth: Payer: Self-pay | Admitting: *Deleted

## 2021-01-07 ENCOUNTER — Other Ambulatory Visit: Payer: Self-pay | Admitting: Physical Medicine and Rehabilitation

## 2021-01-07 MED ORDER — AMITRIPTYLINE HCL 10 MG PO TABS: 10.0000 mg | ORAL_TABLET | Freq: Every day | ORAL | 1 refills | Status: DC

## 2021-01-07 NOTE — Telephone Encounter (Signed)
Notified. 

## 2021-01-07 NOTE — Telephone Encounter (Signed)
Tyler Zuniga called and states that he picked up the cymbalta from the pharmacy but he was reading about it and he does not want to take something that stays in his system all day because he is a long distance truck driver.  He really just needs something he can take at night after he stops driving to help him sleep and is requesting something mild.

## 2021-01-18 ENCOUNTER — Other Ambulatory Visit: Payer: Self-pay | Admitting: *Deleted

## 2021-01-18 DIAGNOSIS — F17210 Nicotine dependence, cigarettes, uncomplicated: Secondary | ICD-10-CM

## 2021-01-31 ENCOUNTER — Other Ambulatory Visit: Payer: Self-pay | Admitting: Physical Medicine and Rehabilitation

## 2021-02-03 ENCOUNTER — Ambulatory Visit
Admission: RE | Admit: 2021-02-03 | Discharge: 2021-02-03 | Disposition: A | Discharge status: Home / Self Care | Payer: Managed Care, Other (non HMO) | Source: Ambulatory Visit | Attending: Physical Medicine and Rehabilitation | Admitting: Physical Medicine and Rehabilitation

## 2021-02-03 ENCOUNTER — Other Ambulatory Visit: Payer: Self-pay

## 2021-02-03 DIAGNOSIS — M4802 Spinal stenosis, cervical region: Secondary | ICD-10-CM

## 2021-02-09 ENCOUNTER — Encounter: Payer: Self-pay | Admitting: Physical Medicine and Rehabilitation

## 2021-02-09 ENCOUNTER — Other Ambulatory Visit: Payer: Self-pay

## 2021-02-09 ENCOUNTER — Encounter
Payer: Managed Care, Other (non HMO) | Attending: Physical Medicine and Rehabilitation | Admitting: Physical Medicine and Rehabilitation

## 2021-02-09 DIAGNOSIS — M4802 Spinal stenosis, cervical region: Secondary | ICD-10-CM | POA: Insufficient documentation

## 2021-02-09 NOTE — Progress Notes (Addendum)
Subjective:    Patient ID: Tyler Zuniga, male    DOB: 1955/07/05, 65 y.o.   MRN: [id number]  HPI An audio/video tele-health visit is felt to be the most appropriate encounter for this patient at this time. This is a follow up tele-visit via phone. The patient is at home. MD is at office. Prior to scheduling this appointment, our staff discussed the limitations of evaluation and management by telemedicine and the availability of in-person appointments. The patient expressed understanding and agreed to proceed.   Tyler Zuniga is a 65 year old man who presents for cervical foraminal stenosis. -average pain is 7/10 -pain is burning, stabbing, tingling, and aching.  -started when he went on vacation -worse with movement -numbness extends down left arm -fingers never stop tingling.  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  -he notes that range of motion is not as good as it used to be -he is interested in seeing a Chiropodist.    Prostate enlarged  Pain Inventory Average Pain 7 Pain Right Now 7 My pain is burning, stabbing, tingling, and aching  In the last 24 hours, has pain interfered with the following? General activity 8 Relation with others 8 Enjoyment of life 10 What TIME of day is your pain at its worst? morning , daytime, evening, and night Sleep (in general) Poor  Pain is worse with: bending and some activites Pain improves with:  nothing improves pain , takes ibuprofen with no relief Relief from Meds: 0  walk without assistance ability to climb steps?  no do you drive?  no  employed # of hrs/week 70  weakness numbness tingling dizziness confusion depression anxiety  Any changes since last visit?  no  Any changes since last visit?  no    Family History  Problem Relation Age of Onset   Stroke Mother    Arthritis Mother     COPD Father    Alcohol abuse Father    Heart disease Brother    Drug abuse Brother    COPD Brother    Cancer Maternal Grandmother    Arthritis Maternal Grandmother    Heart attack Maternal Grandfather    Social History   Socioeconomic History   Marital status: Married    Spouse name: Not on file   Number of children: Not on file   Years of education: Not on file   Highest education level: Not on file  Occupational History   Not on file  Tobacco Use   Smoking status: Every Day    Packs/day: 2.00    Years: 40.00    Pack years: 80.00    Types: Cigarettes   Smokeless tobacco: Never  Vaping Use   Vaping Use: Never used  Substance and Sexual Activity   Alcohol use: Never   Drug use: Never   Sexual activity: Yes    Partners: Female  Other Topics Concern   Not on file  Social History Narrative   Not on file   Social Determinants of Health   Financial Resource Strain: Not on file  Food Insecurity: Not on file  Transportation Needs: Not on file  Physical Activity: Not on file  Stress: Not on file  Social Connections: Not on file   Past Surgical History:  Procedure Laterality Date   TUMOR REMOVAL     Past Medical History:  Diagnosis Date  Acute lateral meniscus tear of right knee    Asthma    Bronchitis    Heart murmur    There were no vitals taken for this visit.  Opioid Risk Score:   Fall Risk Score:  `1  Depression screen PHQ 2/9  Depression screen [facility] 2/9 01/06/2021 10/22/2020  Decreased Interest 3 0  Down, Depressed, Hopeless 3 0  PHQ - 2 Score 6 0  Altered sleeping 3 -  Tired, decreased energy 3 -  Change in appetite 0 -  Feeling bad or failure about yourself  0 -  Trouble concentrating 3 -  Moving slowly or fidgety/restless 3 -  Suicidal thoughts 0 -  PHQ-9 Score 18 -     Review of Systems  Constitutional: Negative.   HENT: Negative.    Eyes: Negative.   Respiratory: Negative.    Cardiovascular: Negative.   Gastrointestinal:  Negative.   Endocrine: Negative.   Genitourinary: Negative.   Musculoskeletal: Negative.   Skin: Negative.   Allergic/Immunologic: Negative.   Neurological:  Positive for dizziness, weakness and numbness.  Hematological: Negative.   Psychiatric/Behavioral:  Positive for dysphoric mood. The patient is nervous/anxious.       Objective:   Physical Exam Seen by phone.      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  -Discussed benefits of exercise in reducing pain. -Provided with a pain relief journal and discussed that it contains foods and lifestyle tips to naturally help to improve pain. Discussed that these lifestyle strategies are also very good for health unlike some medications which can have negative side effects. Discussed that the act of keeping a journal can be therapeutic and helpful to realize patterns what helps to trigger and alleviate pain.   Ordered MRI of spine given radicular symptoms, associated dizziness. Reviewed results with him shows left sided C4-C5 and C5-C6 foraminal impingement. -discussed the benefits of PT.  -discussed ESI.  -Discussed following foods that may reduce pain. Reinforced that these are natural ways to decrease pain 1) Ginger (especially studied for arthritis)- reduce leukotriene production to decrease inflammation 2) Blueberries- high in phytonutrients that decrease inflammation 3) Salmon- marine omega-3s reduce joint swelling and pain 4) Pumpkin seeds- reduce inflammation 5) dark chocolate- reduces inflammation 6) turmeric- reduces inflammation 7) tart cherries - reduce pain and stiffness 8) extra virgin olive oil - its compound olecanthal helps to block prostaglandins  9) chili peppers- can be eaten or applied topically via capsaicin 10) mint- helpful for headache, muscle aches, joint pain, and itching 11) garlic- reduces  inflammation  Link to further information on diet for chronic pain: [email or web address]   2) HTN: -BP is last visit -Advised checking BP daily at home and logging results to bring into follow-up appointment with her PCP and myself. -Reviewed BP meds today.  -Advised regarding healthy foods that can help lower blood pressure and provided with a list: 1) citrus foods- high in vitamins and minerals 2) salmon and other fatty fish - reduces inflammation and oxylipins 3) swiss chard (leafy green)- high level of nitrates 4) pumpkin seeds- one of the best natural sources of magnesium 5) Beans and lentils- high in fiber, magnesium, and potassium 6) Berries- high in flavonoids 7) Amaranth (whole grain, can be cooked similarly to rice and oats)- high in magnesium and fiber 8) Pistachios- even more effective at reducing BP than other nuts 9) Carrots- high in phenolic compounds that  relax blood vessels and reduce inflammation 10) Celery- contain phthalides that relax tissues of arterial walls 11) Tomatoes- can also improve cholesterol and reduce risk of heart disease 12) Broccoli- good source of magnesium, calcium, and potassium 13) Greek yogurt: high in potassium and calcium 14) Herbs and spices: Celery seed, cilantro, saffron, lemongrass, black cumin, ginseng, cinnamon, cardamom, sweet basil, and ginger 15) Chia and flax seeds- also help to lower cholesterol and blood sugar 16) Beets- high levels of nitrates that relax blood vessels  17) spinach and bananas- high in potassium  -Provided lise of supplements that can help with hypertension:  1) magnesium: one high quality brand is Bioptemizers since it contains all 7 types of magnesium, otherwise over the counter magnesium gluconate 400mg  is a good option 2) B vitamins 3) vitamin D 4) potassium 5) CoQ10 6) L-arginine 7) Vitamin C 8) Beetroot -Educated that goal BP is  120/80. -Made goal to incorporate some of the above foods into diet.    3) BPH: -started on medicine for this by his PCP  14 minutes spent in discussion of his pain, review of his MRI results, discussion of treatment options.

## 2021-02-09 NOTE — Addendum Note (Signed)
Addended by: Horton Chin on: 02/09/2021 11:28 AM   Modules accepted: Orders

## 2021-02-10 ENCOUNTER — Other Ambulatory Visit: Payer: Self-pay | Admitting: Internal Medicine

## 2021-02-10 DIAGNOSIS — J418 Mixed simple and mucopurulent chronic bronchitis: Secondary | ICD-10-CM

## 2021-03-03 ENCOUNTER — Encounter: Payer: Self-pay | Admitting: Acute Care

## 2021-03-03 ENCOUNTER — Ambulatory Visit (INDEPENDENT_AMBULATORY_CARE_PROVIDER_SITE_OTHER): Payer: Managed Care, Other (non HMO) | Admitting: Acute Care

## 2021-03-03 ENCOUNTER — Other Ambulatory Visit: Payer: Self-pay

## 2021-03-03 DIAGNOSIS — F17210 Nicotine dependence, cigarettes, uncomplicated: Secondary | ICD-10-CM | POA: Diagnosis not present

## 2021-03-03 NOTE — Patient Instructions (Signed)
Thank you for participating in the [facility]. °It was our pleasure to meet you today. °We will call you with the results of your scan within the next few days. °Your scan will be assigned a Lung RADS category score by the physicians reading the scans.  °This Lung RADS score determines follow up scanning.  °See below for description of categories, and follow up screening recommendations. °We will be in touch to schedule your follow up screening annually or based on recommendations of our providers. °We will fax a copy of your scan results to your Primary Care Physician, or the physician who referred you to the program, to ensure they have the results. °Please call the office if you have any questions or concerns regarding your scanning experience or results.  °Our office number is [phone]. °Please speak with Denise Phelps, RN. She is our Lung Cancer Screening RN. °If she is unavailable when you call, please have the office staff send her a message. She will return your call at her earliest convenience. °Remember, if your scan is normal, we will scan you annually as long as you continue to meet the criteria for the program. (Age 55-77, Current smoker or smoker who has quit within the last 15 years). °If you are a smoker, remember, quitting is the single most powerful action that you can take to decrease your risk of lung cancer and other pulmonary, breathing related problems. °We know quitting is hard, and we are here to help.  °Please let us know if there is anything we can do to help you meet your goal of quitting. °If you are a former smoker, congratulations. We are proud of you! Remain smoke free! °Remember you can refer friends or family members through the number above.  °We will screen them to make sure they meet criteria for the program. °Thank you for helping us take better care of you by participating in Lung Screening. ° °You can receive free nicotine replacement therapy  ( patches, gum or mints) by calling 1-800-QUIT NOW. Please call so we can get you on the path to becoming  a non-smoker. I know it is hard, but you can do this! ° °Lung RADS Categories: ° °Lung RADS 1: no nodules or definitely non-concerning nodules.  °Recommendation is for a repeat annual scan in 12 months. ° °Lung RADS 2:  nodules that are non-concerning in appearance and behavior with a very low likelihood of becoming an active cancer. °Recommendation is for a repeat annual scan in 12 months. ° °Lung RADS 3: nodules that are probably non-concerning , includes nodules with a low likelihood of becoming an active cancer.  Recommendation is for a 6-month repeat screening scan. Often noted after an upper respiratory illness. We will be in touch to make sure you have no questions, and to schedule your 6-month scan. ° °Lung RADS 4 A: nodules with concerning findings, recommendation is most often for a follow up scan in 3 months or additional testing based on our provider's assessment of the scan. We will be in touch to make sure you have no questions and to schedule the recommended 3 month follow up scan. ° °Lung RADS 4 B:  indicates findings that are concerning. We will be in touch with you to schedule additional diagnostic testing based on our provider's  assessment of the scan. ° °Hypnosis for smoking cessation  °[employer] °[phone] ° °Acupuncture for smoking cessation  °[facility] °[phone]  °

## 2021-03-03 NOTE — Progress Notes (Signed)
Virtual Visit via Telephone Note  I connected with Usiel Astarita on 03/03/21 at 12:00 PM EST by telephone and verified that I am speaking with the correct person using two identifiers.  Location: Patient: At home Provider: [address], [place], [place], [address]    I discussed the limitations, risks, security and privacy concerns of performing an evaluation and management service by telephone and the availability of in person appointments. I also discussed with the patient that there may be a patient responsible charge related to this service. The patient expressed understanding and agreed to proceed.  CT Scheduled for 03/09/2021    Shared Decision Making Visit Lung Cancer Screening Program [phone])   Eligibility: Age 65 y.o. Pack Years Smoking History Calculation 112 pack year smoking history (# packs/per year x # years smoked) Recent History of coughing up blood  no Unexplained weight loss? no ( >Than 15 pounds within the last 6 months ) Prior History Lung / other cancer no (Diagnosis within the last 5 years already requiring surveillance chest CT Scans). Smoking Status Current Smoker Former Smokers: Years since quit: NA  Quit Date: NA  Visit Components: Discussion included one or more decision making aids. yes Discussion included risk/benefits of screening. yes Discussion included potential follow up diagnostic testing for abnormal scans. yes Discussion included meaning and risk of over diagnosis. yes Discussion included meaning and risk of False Positives. yes Discussion included meaning of total radiation exposure. yes  Counseling Included: Importance of adherence to annual lung cancer LDCT screening. yes Impact of comorbidities on ability to participate in the program. yes Ability and willingness to under diagnostic treatment. yes  Smoking Cessation Counseling: Current Smokers:  Discussed importance of smoking cessation. yes Information about tobacco  cessation classes and interventions provided to patient. yes Patient provided with "ticket" for LDCT Scan. yes Symptomatic Patient. no  Counseling NA Diagnosis Code: Tobacco Use Z72.0 Asymptomatic Patient yes  Counseling (Intermediate counseling: > three minutes counseling) [id number] Former Smokers:  Discussed the importance of maintaining cigarette abstinence. yes Diagnosis Code: Personal History of Nicotine Dependence. W29.562 Information about tobacco cessation classes and interventions provided to patient. Yes Patient provided with "ticket" for LDCT Scan. yes Written Order for Lung Cancer Screening with LDCT placed in Epic. Yes (CT Chest Lung Cancer Screening Low Dose W/O CM) [id number] Z12.2-Screening of respiratory organs Z87.891-Personal history of nicotine dependence  I have spent 25 minutes of face to face/ virtual visit   time with Mr. Minervini discussing the risks and benefits of lung cancer screening. We viewed / discussed a power point together that explained in detail the above noted topics. We paused at intervals to allow for questions to be asked and answered to ensure understanding.We discussed that the single most powerful action that he can take to decrease his risk of developing lung cancer is to quit smoking. We discussed whether or not he is ready to commit to setting a quit date. We discussed options for tools to aid in quitting smoking including nicotine replacement therapy, non-nicotine medications, support groups, Quit Smart classes, and behavior modification. We discussed that often times setting smaller, more achievable goals, such as eliminating 1 cigarette a day for a week and then 2 cigarettes a day for a week can be helpful in slowly decreasing the number of cigarettes smoked. This allows for a sense of accomplishment as well as providing a clinical benefit. I provided  him  with smoking cessation  information  with contact information for community resources, classes,  free  nicotine replacement therapy, and access to mobile apps, text messaging, and on-line smoking cessation help. I have also provided  him  the office contact information in the event he needs to contact me, or the screening staff. We discussed the time and location of the scan, and that either Abigail Miyamoto RN, Karlton Lemon, RN  or I will call / send a letter with the results within 24-72 hours of receiving them. The patient verbalized understanding of all of  the above and had no further questions upon leaving the office. They have my contact information in the event they have any further questions.  I spent 3 minutes counseling on smoking cessation and the health risks of continued tobacco abuse.  I explained to the patient that there has been a high incidence of coronary artery disease noted on these exams. I explained that this is a non-gated exam therefore degree or severity cannot be determined. This patient is not on statin therapy. I have asked the patient to follow-up with their PCP regarding any incidental finding of coronary artery disease and management with diet or medication as their PCP  feels is clinically indicated. The patient verbalized understanding of the above and had no further questions upon completion of the visit.      Bevelyn Ngo, NP 03/03/2021

## 2021-03-04 ENCOUNTER — Ambulatory Visit: Payer: Managed Care, Other (non HMO)

## 2021-03-09 ENCOUNTER — Ambulatory Visit
Admission: RE | Admit: 2021-03-09 | Discharge: 2021-03-09 | Disposition: A | Discharge status: Home / Self Care | Payer: Managed Care, Other (non HMO) | Source: Ambulatory Visit | Attending: Acute Care | Admitting: Acute Care

## 2021-03-09 DIAGNOSIS — F17210 Nicotine dependence, cigarettes, uncomplicated: Secondary | ICD-10-CM

## 2021-03-12 ENCOUNTER — Other Ambulatory Visit: Payer: Self-pay | Admitting: Acute Care

## 2021-03-12 DIAGNOSIS — F17210 Nicotine dependence, cigarettes, uncomplicated: Secondary | ICD-10-CM

## 2021-03-12 DIAGNOSIS — Z87891 Personal history of nicotine dependence: Secondary | ICD-10-CM

## 2021-04-19 ENCOUNTER — Ambulatory Visit: Payer: Managed Care, Other (non HMO) | Admitting: Physical Medicine and Rehabilitation

## 2021-05-04 ENCOUNTER — Telehealth: Payer: Self-pay | Admitting: Acute Care

## 2021-05-04 NOTE — Telephone Encounter (Signed)
Returned call to patient regarding LDCT results.  Patient states he doesn't recall getting a letter in the mail.  Results showed tiny lung nodules which were not found to be suspicious or concerning for lung cancer.  Emphysema and atherosclerosis were noted, as well as, the thoracic aorta being a little wider than normal.  Radiologist recommends following this each year with each CT scan.  Patient is not on statin medication, stating he was always told his cholesterol was good. Discussed the importance of blood pressure control, noting the last documented reading in his chart was elevated.  Patient states it is up on occasion but he does not take medication for it.  Patient has appt with PCP in April and will discuss these findings at that visit.  Patient had no further questions and verbalized understanding.

## 2021-05-25 ENCOUNTER — Encounter: Payer: Managed Care, Other (non HMO) | Admitting: Physical Medicine and Rehabilitation

## 2021-05-26 ENCOUNTER — Other Ambulatory Visit: Payer: Self-pay | Admitting: Internal Medicine

## 2021-05-26 DIAGNOSIS — J418 Mixed simple and mucopurulent chronic bronchitis: Secondary | ICD-10-CM

## 2021-06-02 LAB — PSA: PSA: 4.45

## 2021-06-28 ENCOUNTER — Ambulatory Visit (INDEPENDENT_AMBULATORY_CARE_PROVIDER_SITE_OTHER): Payer: Managed Care, Other (non HMO) | Admitting: Internal Medicine

## 2021-06-28 ENCOUNTER — Encounter: Payer: Self-pay | Admitting: Internal Medicine

## 2021-06-28 VITALS — BP 136/80 | HR 66 | Temp 98.2°F | Resp 16 | Ht 72.0 in | Wt 205.0 lb

## 2021-06-28 DIAGNOSIS — H6123 Impacted cerumen, bilateral: Secondary | ICD-10-CM | POA: Insufficient documentation

## 2021-06-28 DIAGNOSIS — R972 Elevated prostate specific antigen [PSA]: Secondary | ICD-10-CM | POA: Insufficient documentation

## 2021-06-28 DIAGNOSIS — M720 Palmar fascial fibromatosis [Dupuytren]: Secondary | ICD-10-CM | POA: Diagnosis not present

## 2021-06-28 DIAGNOSIS — J418 Mixed simple and mucopurulent chronic bronchitis: Secondary | ICD-10-CM | POA: Diagnosis not present

## 2021-06-28 DIAGNOSIS — Z1211 Encounter for screening for malignant neoplasm of colon: Secondary | ICD-10-CM

## 2021-06-28 MED ORDER — TRELEGY ELLIPTA 100-62.5-25 MCG/ACT IN AEPB: 1.0000 | INHALATION_SPRAY | Freq: Every day | RESPIRATORY_TRACT | 1 refills | Status: DC

## 2021-06-28 NOTE — Progress Notes (Signed)
[email or web address] ? ?Subjective:  ?Patient ID: Tyler Zuniga, male    DOB: 22-Nov-1955  Age: 66 y.o. MRN: [id number] ? ?CC: COPD ? ? ?HPI ?Tyler Zuniga presents for f/up - ? ?He has noticed some melena recently but has also been taking Pepto-Bismol for upset stomach.  He continues to complain of cough productive of clear phlegm.  He denies wheezing, shortness of breath, fever, chills, night sweats, or hemoptysis. ? ?Outpatient Medications Prior to Visit  ?Medication Sig Dispense Refill  ? albuterol (VENTOLIN HFA) 108 (90 Base) MCG/ACT inhaler TAKE 2 PUFFS BY MOUTH EVERY 6 HOURS AS NEEDED FOR WHEEZE OR SHORTNESS OF BREATH 18 each 3  ? vardenafil (LEVITRA) 20 MG tablet Take 1 tablet (20 mg total) by mouth daily as needed for erectile dysfunction. 15 tablet 2  ? ?No facility-administered medications prior to visit.  ? ? ?ROS ?Review of Systems  ?HENT:  Positive for hearing loss. Negative for congestion, sinus pressure, sneezing and sore throat.   ?Cardiovascular:  Negative for chest pain, palpitations and leg swelling.  ?Gastrointestinal: Negative.  Negative for abdominal pain, constipation, diarrhea, nausea and vomiting.  ?Genitourinary: Negative.  Negative for difficulty urinating.  ?Musculoskeletal: Negative.   ?Skin: Negative.   ?Neurological: Negative.   ?Hematological:  Negative for adenopathy. Does not bruise/bleed easily.  ?Psychiatric/Behavioral: Negative.    ? ?Objective:  ?BP 136/80 (BP Location: Right Arm, Patient Position: Sitting, Cuff Size: Large)   Pulse 66   Temp 98.2 ?F (36.8 ?C) (Oral)   Resp 16   Ht 6' (1.829 m)   Wt 205 lb (93 kg)   SpO2 97%   BMI 27.80 kg/m?  ? ?BP Readings from Last 3 Encounters:  ?06/28/21 136/80  ?01/06/21 (!) 162/95  ?12/02/20 132/82  ? ? ?Wt Readings from Last 3 Encounters:  ?06/28/21 205 lb (93 kg)  ?01/06/21 212 lb 3.2 oz (96.3 kg)  ?12/02/20 208 lb (94.3 kg)  ? ? ?Physical Exam ?Vitals reviewed.  ?HENT:  ?   Right Ear: Tympanic membrane and external ear normal. Decreased  hearing noted. There is impacted cerumen.  ?   Left Ear: Tympanic membrane and external ear normal. Decreased hearing noted. There is impacted cerumen.  ?   Ears:  ?   Comments: We irrigated his ears with water and all of the cerumen was removed.  He tolerated this well.  The examination afterwards is normal. His hearing has improved. ?   Mouth/Throat:  ?   Mouth: Mucous membranes are moist.  ?Eyes:  ?   General: No scleral icterus. ?   Conjunctiva/sclera: Conjunctivae normal.  ?Cardiovascular:  ?   Rate and Rhythm: Normal rate and regular rhythm.  ?   Heart sounds: No murmur heard. ?Pulmonary:  ?   Effort: Pulmonary effort is normal.  ?   Breath sounds: No stridor. No wheezing, rhonchi or rales.  ?Abdominal:  ?   General: Abdomen is flat.  ?   Palpations: There is no mass.  ?   Tenderness: There is no abdominal tenderness. There is no guarding.  ?   Hernia: No hernia is present. There is no hernia in the left inguinal area or right inguinal area.  ?Genitourinary: ?   Penis: Normal.   ?   Testes: Normal.  ?   Epididymis:  ?   Right: Normal.  ?   Left: Normal.  ?   Prostate: Enlarged. Not tender and no nodules present.  ?   Rectum: Normal. Guaiac result negative. No mass, tenderness, anal  fissure, external hemorrhoid or internal hemorrhoid. Normal anal tone.  ?Musculoskeletal:     ?   General: Normal range of motion.  ?   Cervical back: Neck supple.  ?   Right lower leg: No edema.  ?   Left lower leg: No edema.  ?Lymphadenopathy:  ?   Cervical: No cervical adenopathy.  ?   Lower Body: No right inguinal adenopathy. No left inguinal adenopathy.  ?Skin: ?   General: Skin is warm and dry.  ?Neurological:  ?   General: No focal deficit present.  ?Psychiatric:     ?   Mood and Affect: Mood normal.     ?   Behavior: Behavior normal.  ? ? ?Lab Results  ?Component Value Date  ? WBC 7.2 10/22/2020  ? HGB 14.9 10/22/2020  ? HCT 45.3 10/22/2020  ? PLT 258.0 10/22/2020  ? GLUCOSE 85 10/22/2020  ? CHOL 186 10/22/2020  ? TRIG 89.0  10/22/2020  ? HDL 57.60 10/22/2020  ? LDLCALC 111 (H) 10/22/2020  ? ALT 17 10/22/2020  ? AST 21 10/22/2020  ? NA 139 10/22/2020  ? K 4.2 10/22/2020  ? CL 103 10/22/2020  ? CREATININE 1.17 10/22/2020  ? BUN 16 10/22/2020  ? CO2 27 10/22/2020  ? TSH 0.49 10/22/2020  ? PSA 4.45 06/02/2021  ? ? ?CT CHEST LUNG CA SCREEN LOW DOSE W/O CM ? ?Result Date: 03/10/2021 ?CLINICAL DATA:  66 year old asymptomatic male current smoker with 112 pack-year smoking history. EXAM: CT CHEST WITHOUT CONTRAST LOW-DOSE FOR LUNG CANCER SCREENING TECHNIQUE: Multidetector CT imaging of the chest was performed following the standard protocol without IV contrast. COMPARISON:  None. FINDINGS: Cardiovascular: Normal heart size. No significant pericardial effusion/thickening. Atherosclerotic thoracic aorta with dilated 4.2 cm ascending thoracic aorta. Top-normal caliber main pulmonary artery (3.4 cm diameter). Mediastinum/Nodes: No discrete thyroid nodules. Unremarkable esophagus. No pathologically enlarged axillary, mediastinal or hilar lymph nodes, noting limited sensitivity for the detection of hilar adenopathy on this noncontrast study. Lungs/Pleura: No pneumothorax. No pleural effusion. Moderate centrilobular emphysema with diffuse bronchial wall thickening. No acute consolidative airspace disease or lung masses. A few scattered tiny calcified bilateral pulmonary granulomas. No significant pulmonary nodules. Upper abdomen: No acute abnormality. Musculoskeletal: No aggressive appearing focal osseous lesions. Mild thoracic spondylosis. IMPRESSION: Lung-RADS 1, negative. Continue annual screening with low-dose chest CT without contrast in 12 months. Dilated 4.2 cm ascending thoracic aorta, which can be reassessed on follow-up screening chest CT in 12 months. Aortic Atherosclerosis (ICD10-I70.0) and Emphysema (ICD10-J43.9). Electronically Signed   By: Delbert PhenixJason A Poff M.D.   On: 03/10/2021 08:45 ? ? ?Assessment & Plan:  ? ?Tyler Zuniga was seen today for  copd. ? ?Diagnoses and all orders for this visit: ? ?Mixed simple and mucopurulent chronic bronchitis (HCC)- Will treat with a LAMA/LABA/ICS inhaler. ?-     Fluticasone-Umeclidin-Vilant (TRELEGY ELLIPTA) 100-62.5-25 MCG/ACT AEPB; Inhale 1 puff into the lungs daily. ? ?Elevated PSA, less than 10 ng/ml- He is being followed by urology. ?-     Cancel: PSA; Future ? ?Colon cancer screening ?-     Cologuard ? ?Hearing loss due to cerumen impaction, bilateral- Improvement noted. ? ?Dupuytren's contracture of right hand ?-     Ambulatory referral to Orthopedic Surgery ? ? ?I am having Tyler Zuniga start on Trelegy Ellipta. I am also having him maintain his vardenafil and albuterol. ? ?Meds ordered this encounter  ?Medications  ? Fluticasone-Umeclidin-Vilant (TRELEGY ELLIPTA) 100-62.5-25 MCG/ACT AEPB  ?  Sig: Inhale 1 puff into the lungs daily.  ?  Dispense:  120 each  ?  Refill:  1  ? ? ? ?Follow-up: Return in about 6 months (around 12/28/2021). ? ?Sanda Linger, MD ?

## 2021-06-28 NOTE — Progress Notes (Signed)
Patient consent obtained. Irrigation with water and peroxide performed. Full view of tympanic membranes after procedure.  Patient tolerated procedure well.   

## 2021-06-28 NOTE — Patient Instructions (Signed)
Chronic Obstructive Pulmonary Disease ? ?Chronic obstructive pulmonary disease (COPD) is a long-term (chronic) condition that affects the lungs. COPD is a general term that can be used to describe many different lung problems that cause lung inflammation and limit airflow, including chronic bronchitis and emphysema. ?If you have COPD, your lung function will probably never return to normal. In most cases, it gets worse over time. However, there are steps you can take to slow the progression of the disease and improve your quality of life. ?What are the causes? ?This condition may be caused by: ?Smoking. This is the most common cause. ?Certain genes passed down through families. ?What increases the risk? ?The following factors may make you more likely to develop this condition: ?Being exposed to secondhand smoke from cigarettes, pipes, or cigars. ?Being exposed to chemicals and other irritants, such as fumes and dust in the work environment. ?Having chronic lung conditions or infections. ?What are the signs or symptoms? ?Symptoms of this condition include: ?Shortness of breath, especially during physical activity. ?Chronic cough with a large amount of thick mucus. Sometimes, the cough may not have any mucus (dry cough). ?Wheezing and rapid breathing. ?Gray or bluish discoloration (cyanosis) of the skin, especially in the fingers, toes, or lips. ?Feeling tired (fatigue). ?Weight loss. ?Chest tightness. ?Frequent infections. ?Episodes when breathing symptoms become much worse (exacerbations). ?At the later stages of this disease, you may have swelling in the ankles, feet, or legs. ?How is this diagnosed? ?This condition is diagnosed based on: ?Your medical history. ?A physical exam. ?You may also have tests, including: ?Lung (pulmonary) function tests. This may include a spirometry test, which measures your ability to exhale properly. ?Chest X-ray. ?CT scan. ?Blood tests. ?How is this treated? ?This condition may be  treated with: ?Medicines. These may include inhaled rescue medicines to treat acute exacerbations as well as medicines that you take long-term (maintenance medicines) to prevent flare-ups of COPD. ?Bronchodilators help treat COPD by dilating the airways to allow increased airflow and make your breathing more comfortable. ?Steroids can reduce airway inflammation and help prevent exacerbations. ?Smoking cessation. If you smoke, your health care provider may ask you to quit, and may also recommend therapy or replacement products to help you quit. ?Pulmonary rehabilitation. This may involve working with a team of health care providers and specialists, such as respiratory, occupational, and physical therapists. ?Exercise and physical activity. These are beneficial for nearly all people with COPD. ?Nutrition therapy to gain weight, if you are underweight. ?Oxygen. Supplemental oxygen therapy is only helpful if you have a low oxygen level in your blood (hypoxemia). ?Lung surgery or transplant. ?Palliative care. This is to help people with COPD feel comfortable when treatment is no longer working. ?Follow these instructions at home: ?Medicines ?Take over-the-counter and prescription medicines only as told by your health care provider. This includes inhaled medicines and pills. ?Talk to your health care provider before taking any cough or allergy medicines. You may need to avoid certain medicines that dry out your airways. ?Lifestyle ?If you smoke, the most important thing that you can do is to stop smoking. Continuing to smoke will cause the disease to progress faster. ?Do not use any products that contain nicotine or tobacco. These products include cigarettes, chewing tobacco, and vaping devices, such as e-cigarettes. If you need help quitting, ask your health care provider. ?Avoid exposure to things that irritate your lungs, such as smoke, chemicals, and fumes. ?Stay active, but balance activity with periods of rest.  Exercise and   physical activity will help you maintain your ability to do things you want to do. ?Learn and use relaxation techniques to manage stress and to control your breathing. ?Get the right amount of sleep and get quality sleep. Most adults need 7 or more hours per night. ?Eat healthy foods. Eating smaller, more frequent meals and resting before meals may help you maintain your strength. ?Controlled breathing ?Learn and use controlled breathing techniques as directed by your health care provider. Controlled breathing techniques include: ?Pursed lip breathing. Start by breathing in (inhaling) through your nose for 1 second. Then, purse your lips as if you were going to whistle and breathe out (exhale) through the pursed lips for 2 seconds. ?Diaphragmatic breathing. Start by putting one hand on your abdomen just above your waist. Inhale slowly through your nose. The hand on your abdomen should move out. Then purse your lips and exhale slowly. You should be able to feel the hand on your abdomen moving in as you exhale. ? ?Controlled coughing ?Learn and use controlled coughing to clear mucus from your lungs. Controlled coughing is a series of short, progressive coughs. The steps of controlled coughing are: ?Lean your head slightly forward. ?Breathe in deeply using diaphragmatic breathing. ?Try to hold your breath for 3 seconds. ?Keep your mouth slightly open while coughing twice. ?Spit any mucus out into a tissue. ?Rest and repeat the steps once or twice as needed. ?General instructions ?Make sure you receive all the vaccines that your health care provider recommends, especially the pneumococcal and influenza vaccines. Preventing infection and hospitalization is very important when you have COPD. ?Drink enough fluid to keep your urine pale yellow, unless you have a medical condition that requires fluid restriction. ?Use oxygen therapy and pulmonary rehabilitation if told by your health care provider. If you  require home oxygen therapy, ask your health care provider whether you should purchase a pulse oximeter to measure your oxygen level at home. ?Work with your health care provider to develop a COPD action plan. This will help you know what steps to take if your condition gets worse. ?Keep other chronic health conditions under control as told by your health care provider. ?Avoid extreme temperature and humidity changes. ?Avoid contact with people who have an illness that spreads from person to person (is contagious), such as viral infections or pneumonia. ?Keep all follow-up visits. This is important. ?Contact a health care provider if: ?You are coughing up more mucus than usual. ?There is a change in the color or thickness of your mucus. ?Your breathing is more labored than usual. ?Your breathing is faster than usual. ?You have difficulty sleeping. ?You need to use your rescue medicines or inhalers more often than expected. ?You have trouble doing routine activities such as getting dressed or walking around the house. ?Get help right away if: ?You have shortness of breath while you are resting. ?You have shortness of breath that prevents you from: ?Being able to talk. ?Performing your usual physical activities. ?You have chest pain lasting longer than 5 minutes. ?Your skin color is more blue (cyanotic) than usual. ?You measure low oxygen saturations for longer than 5 minutes with a pulse oximeter. ?You have a fever. ?You feel too tired to breathe normally. ?These symptoms may represent a serious problem that is an emergency. Do not wait to see if the symptoms will go away. Get medical help right away. Call your local emergency services (911 in the [place] Do not drive yourself to the hospital. ?Summary ?Chronic obstructive   pulmonary disease (COPD) is a long-term (chronic) condition that affects the lungs. ?Your lung function will probably never return to normal. In most cases, it gets worse over time. However, there  are steps you can take to slow the progression of the disease and improve your quality of life. ?Treatment for COPD may include taking medicines, quitting smoking, pulmonary rehabilitation, and changes to diet and

## 2021-08-10 ENCOUNTER — Encounter: Payer: Self-pay | Admitting: Orthopedic Surgery

## 2021-08-10 ENCOUNTER — Encounter: Payer: Self-pay | Admitting: Physical Medicine and Rehabilitation

## 2021-08-10 ENCOUNTER — Ambulatory Visit (INDEPENDENT_AMBULATORY_CARE_PROVIDER_SITE_OTHER): Payer: Managed Care, Other (non HMO)

## 2021-08-10 ENCOUNTER — Ambulatory Visit (INDEPENDENT_AMBULATORY_CARE_PROVIDER_SITE_OTHER): Payer: Managed Care, Other (non HMO) | Admitting: Orthopedic Surgery

## 2021-08-10 ENCOUNTER — Encounter: Payer: Medicare HMO | Attending: Physical Medicine and Rehabilitation | Admitting: Physical Medicine and Rehabilitation

## 2021-08-10 VITALS — BP 130/84 | HR 65 | Ht 72.0 in | Wt 201.0 lb

## 2021-08-10 VITALS — BP 124/86 | HR 75 | Ht 72.0 in | Wt 205.0 lb

## 2021-08-10 DIAGNOSIS — N400 Enlarged prostate without lower urinary tract symptoms: Secondary | ICD-10-CM | POA: Diagnosis not present

## 2021-08-10 DIAGNOSIS — M720 Palmar fascial fibromatosis [Dupuytren]: Secondary | ICD-10-CM | POA: Diagnosis not present

## 2021-08-10 DIAGNOSIS — M19042 Primary osteoarthritis, left hand: Secondary | ICD-10-CM | POA: Insufficient documentation

## 2021-08-10 DIAGNOSIS — M1812 Unilateral primary osteoarthritis of first carpometacarpal joint, left hand: Secondary | ICD-10-CM | POA: Diagnosis not present

## 2021-08-10 DIAGNOSIS — M79645 Pain in left finger(s): Secondary | ICD-10-CM

## 2021-08-10 DIAGNOSIS — R200 Anesthesia of skin: Secondary | ICD-10-CM | POA: Diagnosis not present

## 2021-08-10 DIAGNOSIS — R972 Elevated prostate specific antigen [PSA]: Secondary | ICD-10-CM | POA: Insufficient documentation

## 2021-08-10 DIAGNOSIS — M4802 Spinal stenosis, cervical region: Secondary | ICD-10-CM | POA: Insufficient documentation

## 2021-08-10 LAB — COLOGUARD: Cologuard: NEGATIVE

## 2021-08-10 MED ORDER — MELOXICAM 7.5 MG PO TABS: 7.5000 mg | ORAL_TABLET | Freq: Every day | ORAL | 0 refills | Status: DC

## 2021-08-10 NOTE — Progress Notes (Signed)
Office Visit Note   Patient: Tyler Zuniga           Date of Birth: 05/28/1955           MRN: [id number] Visit Date: 08/10/2021              Requested by: Etta Grandchild, MD [address] [place],  [place] [address] PCP: Etta Grandchild, MD   Assessment & Plan: Visit Diagnoses:  1. Pain of left thumb   2. Dupuytren's contracture of right hand   3. 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  We reviewed the nature of osteoarthritis as well as his diagnosis, prognosis, and both conservative and surgical treatment options.  After our discussion, he would like to start with an oral anti-inflammatory medication.  We also discussed the nature of Dupuytren's contracture.  He has a pretendinous cord in the right hand in line with the small finger.  He has no contracture at the MP or PIP joints.  After our discussion, he would like to continue to monitor his disease for now.  Follow-Up Instructions: No follow-ups on file.   Orders:  Orders Placed This Encounter  Procedures   XR Hand Complete Left   No orders of the defined types were placed in this encounter.     Procedures: No procedures performed   Clinical Data: No additional findings.   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  His left thumb pains been going on since around last summer.  It started with concurrent neck pain and radiculopathy.  He had initially had numbness in all of his fingers.  This is resolved and he only has numbness in the very tip of the thumb.  His pain at the MCP joint can be as bad as 10/10 when he grips the steering wheel.  The  pain is intermittent and he has long periods with no pain at all.  He is taking ibuprofen very sporadically.  He is otherwise had no treatment for this.  He denies pain at the [facility] joint.  Regarding the right hand, he has noticed some nodules in the palm in line with the small finger.  This has been going on for years now.  He thinks the nodules have slightly grown in size since they were first noticed.  He has no contracture in the hand.  He has no loss of function of his hand.  He is more curious about what the nodules represent   Review of Systems   Objective: Vital Signs: BP 124/86 (BP Location: Left Arm, Patient Position: Sitting)   Pulse 75   Ht 6' (1.829 m)   Wt 205 lb (93 kg)   BMI 27.80 kg/m   Physical Exam Constitutional:      Appearance: Normal appearance.  Cardiovascular:     Rate and Rhythm: Normal rate.     Pulses: Normal pulses.  Pulmonary:     Effort: Pulmonary effort is normal.  Skin:    General: Skin is warm and dry.     Capillary Refill: Capillary refill takes less than 2 seconds.  Neurological:  Mental Status: He is alert.    Right Hand Exam   Tenderness  The patient is experiencing no tenderness.   Range of Motion  The patient has normal right wrist ROM.   Other  Erythema: absent Sensation: normal Pulse: present  Comments:  Pretendinous cord in line with small finger w/ small nodules present.  No contracture at MP or PIP joint of small finger.  No other cords or nodules present   Left Hand Exam   Tenderness  The patient is experiencing no tenderness.   Range of Motion  The patient has normal left wrist ROM.  Other  Erythema: absent Sensation: normal Pulse: present  Comments:  Negative CMC grind test.  No pain w/ PROM/AROM of MCP joint.  No MCP joint hyper-extension or radial/ulnar instability.      Specialty Comments:  No specialty comments available.  Imaging: XR Hand Complete Left  Result Date: 08/10/2021 Multiple views  left hand taken today reviewed interpreted by me.  They demonstrate mild osteoarthritis of the thumb CMC joint with mild joint space narrowing and small osteophyte formation.  He also has osteoarthritis at the thumb MCP joint with joint space narrowing and osteophyte formation also present.    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 08/10/2021   Elevated PSA, less than 10 ng/ml 06/28/2021   Colon cancer screening 06/28/2021   Hearing loss due to cerumen impaction, bilateral 06/28/2021   Dupuytren's contracture of right hand 06/28/2021   Foraminal stenosis of cervical region 10/23/2020   Need for vaccination 10/23/2020   Mixed simple and mucopurulent chronic bronchitis (HCC) 10/22/2020   Tobacco abuse 10/22/2020   Erectile dysfunction due to arterial insufficiency 10/22/2020   Past Medical History:  Diagnosis Date   Acute lateral meniscus tear of right knee    Asthma    Bronchitis    Heart murmur     Family History  Problem Relation Age of Onset   Stroke Mother    Arthritis Mother    COPD Father    Alcohol abuse Father    Heart disease Brother    Drug abuse Brother    COPD Brother    Cancer Maternal Grandmother    Arthritis Maternal Grandmother    Heart attack Maternal Grandfather     Past Surgical History:  Procedure Laterality Date   TUMOR REMOVAL     Social History   Occupational History   Not on file  Tobacco Use   Smoking status: Every Day    Packs/day: 2.00    Years: 40.00    Pack years: 80.00    Types: Cigarettes   Smokeless tobacco: Never  Vaping Use   Vaping Use: Never used  Substance and Sexual Activity   Alcohol use: Never   Drug use: Never   Sexual activity: Yes    Partners: Female

## 2021-08-10 NOTE — Patient Instructions (Signed)

## 2021-08-10 NOTE — Progress Notes (Signed)
Subjective:    Patient ID: Tyler Zuniga, male    DOB: 11/14/1955, 66 y.o.   MRN: [id number]  HPI An audio/video tele-health visit is felt to be the most appropriate encounter for this patient at this time. This is a follow up tele-visit via phone. The patient is at home. MD is at office. Prior to scheduling this appointment, our staff discussed the limitations of evaluation and management by telemedicine and the availability of in-person appointments. The patient expressed understanding and agreed to proceed.   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  -he feels good with ibuprofen.  -left thumb is still nurmb and gets blue Was found to have arthritis in his joint in right thumb- it hurts. He used the voltaren and it helped. 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  -started when he went on vacation -worse with movement -numbness extends down left arm -fingers never stop tingling.  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  -he notes that range of motion is not as good as it used to be -he is interested in seeing a chiropracter. -lost 11 lbs since he has been eating chicken and vegetables-eats one meal a day.     Prostate enlarged  Pain Inventory Average Pain 5 Pain Right Now 2 My pain is intermittent, tingling, and aching  In the last 24 hours, has pain interfered with the following? General activity 0 Relation with others 0 Enjoyment of life 0 What TIME of day is your pain at its worst? morning , daytime, evening, and night Sleep (in general) Poor  Pain is worse with: bending and  some activites Pain improves with:  nothing improves pain , takes ibuprofen with no relief Relief from Meds: 0      Family History  Problem Relation Age of Onset   Stroke Mother    Arthritis Mother    COPD Father    Alcohol abuse Father    Heart disease Brother    Drug abuse Brother    COPD Brother    Cancer Maternal Grandmother    Arthritis Maternal Grandmother    Heart attack Maternal Grandfather    Social History   Socioeconomic History   Marital status: Married    Spouse name: Not on file   Number of children: Not on file   Years of education: Not on file   Highest education level: Not on file  Occupational History   Not on file  Tobacco Use   Smoking status: Every Day    Packs/day: 2.00    Years: 40.00    Pack years: 80.00    Types: Cigarettes   Smokeless tobacco: Never  Vaping Use   Vaping Use: Never used  Substance and Sexual Activity   Alcohol use: Never   Drug use: Never   Sexual activity: Yes    Partners: Female  Other Topics Concern   Not on file  Social History Narrative   Not on file   Social Determinants of Health   Financial Resource Strain: Not on file  Food Insecurity: Not on file  Transportation Needs: Not  on file  Physical Activity: Not on file  Stress: Not on file  Social Connections: Not on file   Past Surgical History:  Procedure Laterality Date   TUMOR REMOVAL     Past Medical History:  Diagnosis Date   Acute lateral meniscus tear of right knee    Asthma    Bronchitis    Heart murmur    There were no vitals taken for this visit.  Opioid Risk Score:   Fall Risk Score:  `1  Depression screen [facility] 2/9     01/06/2021    3:01 PM 10/22/2020    8:12 AM  Depression screen PHQ 2/9  Decreased Interest 3 0  Down, Depressed, Hopeless 3 0  PHQ - 2 Score 6 0  Altered sleeping 3   Tired, decreased energy 3   Change in appetite 0   Feeling bad or failure about yourself  0   Trouble concentrating 3   Moving slowly or  fidgety/restless 3   Suicidal thoughts 0   PHQ-9 Score 18      Review of Systems  Constitutional: Negative.   HENT: Negative.    Eyes: Negative.   Respiratory: Negative.    Cardiovascular: Negative.   Gastrointestinal: Negative.   Endocrine: Negative.   Genitourinary: Negative.   Musculoskeletal:  Positive for neck pain.       Left thumb pain  Skin: Negative.   Allergic/Immunologic: Negative.   Neurological:  Positive for dizziness, weakness and numbness.  Hematological: Negative.   Psychiatric/Behavioral:  Positive for dysphoric mood. The patient is nervous/anxious.   All other systems reviewed and are negative.     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  -Discussed benefits of exercise in reducing pain. -Provided with a pain relief journal and discussed that it contains foods and lifestyle tips to naturally help to improve pain. Discussed that these lifestyle strategies are also very good for health unlike some medications which can have negative side effects. Discussed that the act of keeping a journal can be therapeutic and helpful to realize patterns what helps to trigger and alleviate pain.   Ordered MRI of spine given radicular symptoms, associated dizziness. Reviewed results with him shows left sided C4-C5 and C5-C6 foraminal impingement. -discussed the benefits of PT.  Continue ibuprofen prn -discussed ESI.  -Discussed following foods that may reduce pain. Reinforced that these are natural ways to decrease pain 1) Ginger (especially studied for  arthritis)- reduce leukotriene production to decrease inflammation 2) Blueberries- high in phytonutrients that decrease inflammation 3) Salmon- marine omega-3s reduce joint swelling and pain 4) Pumpkin seeds- reduce inflammation 5) dark chocolate- reduces inflammation 6) turmeric- reduces inflammation 7) tart cherries - reduce pain and stiffness 8) extra virgin olive oil - its compound olecanthal helps to block prostaglandins  9) chili peppers- can be eaten or applied topically via capsaicin 10) mint- helpful for headache, muscle aches, joint pain, and itching 11) garlic- reduces inflammation  Link to further information on diet for chronic pain: [email or web address]   2) HTN: -BP is last visit -Advised checking BP daily at home and logging  results to bring into follow-up appointment with her PCP and myself. -Reviewed BP meds today.  -Advised regarding healthy foods that can help lower blood pressure and provided with a list: 1) citrus foods- high in vitamins and minerals 2) salmon and other fatty fish - reduces inflammation and oxylipins 3) swiss chard (leafy green)- high level of nitrates 4) pumpkin seeds- one of the best natural sources of magnesium 5) Beans and lentils- high in fiber, magnesium, and potassium 6) Berries- high in flavonoids 7) Amaranth (whole grain, can be cooked similarly to rice and oats)- high in magnesium and fiber 8) Pistachios- even more effective at reducing BP than other nuts 9) Carrots- high in phenolic compounds that relax blood vessels and reduce inflammation 10) Celery- contain phthalides that relax tissues of arterial walls 11) Tomatoes- can also improve cholesterol and reduce risk of heart disease 12) Broccoli- good source of magnesium, calcium, and potassium 13) Greek yogurt: high in potassium and calcium 14) Herbs and spices: Celery seed, cilantro, saffron, lemongrass, black  cumin, ginseng, cinnamon, cardamom, sweet basil, and ginger 15) Chia and flax seeds- also help to lower cholesterol and blood sugar 16) Beets- high levels of nitrates that relax blood vessels  17) spinach and bananas- high in potassium  -Provided lise of supplements that can help with hypertension:  1) magnesium: one high quality brand is Bioptemizers since it contains all 7 types of magnesium, otherwise over the counter magnesium gluconate 400mg  is a good option 2) B vitamins 3) vitamin D 4) potassium 5) CoQ10 6) L-arginine 7) Vitamin C 8) Beetroot -Educated that goal BP is 120/80. -Made goal to incorporate some of the above foods into diet.    3) BPH: -started on medicine for this by his PCP, continue  4) Left thumb OA -continue voltaren, icy hot, balm from [employer].  -discussed the different ingredents -recommended brace when driving.   5) Left thumb numbness -discussed prognosis for recovery

## 2021-08-17 LAB — COLOGUARD

## 2021-09-06 ENCOUNTER — Other Ambulatory Visit: Payer: Self-pay | Admitting: Orthopedic Surgery

## 2021-09-06 MED ORDER — MELOXICAM 7.5 MG PO TABS: 7.5000 mg | ORAL_TABLET | Freq: Every day | ORAL | 1 refills | Status: DC

## 2021-09-28 ENCOUNTER — Other Ambulatory Visit: Payer: Self-pay | Admitting: Internal Medicine

## 2021-09-28 DIAGNOSIS — J418 Mixed simple and mucopurulent chronic bronchitis: Secondary | ICD-10-CM

## 2021-10-03 ENCOUNTER — Other Ambulatory Visit: Payer: Self-pay | Admitting: Orthopedic Surgery

## 2021-10-16 ENCOUNTER — Other Ambulatory Visit: Payer: Self-pay | Admitting: Internal Medicine

## 2021-10-17 ENCOUNTER — Other Ambulatory Visit: Payer: Self-pay | Admitting: Internal Medicine

## 2021-11-05 ENCOUNTER — Other Ambulatory Visit: Payer: Self-pay | Admitting: Orthopedic Surgery

## 2021-11-29 ENCOUNTER — Ambulatory Visit: Payer: Managed Care, Other (non HMO) | Admitting: Orthopedic Surgery

## 2021-11-29 ENCOUNTER — Ambulatory Visit (INDEPENDENT_AMBULATORY_CARE_PROVIDER_SITE_OTHER): Payer: Managed Care, Other (non HMO) | Admitting: Physician Assistant

## 2021-11-29 ENCOUNTER — Encounter: Payer: Self-pay | Admitting: Physician Assistant

## 2021-11-29 VITALS — Ht 71.0 in | Wt 202.0 lb

## 2021-11-29 DIAGNOSIS — M19042 Primary osteoarthritis, left hand: Secondary | ICD-10-CM

## 2021-11-29 DIAGNOSIS — M720 Palmar fascial fibromatosis [Dupuytren]: Secondary | ICD-10-CM | POA: Diagnosis not present

## 2021-11-29 LAB — PSA: PSA: 2.4

## 2021-11-29 MED ORDER — MELOXICAM 7.5 MG PO TABS: 7.5000 mg | ORAL_TABLET | Freq: Every day | ORAL | 1 refills | Status: DC

## 2021-11-29 NOTE — Progress Notes (Signed)
Office Visit Note   Patient: Tyler Zuniga           Date of Birth: 08-06-1955           MRN: [id number] Visit Date: 11/29/2021              Requested by: Etta Grandchild, MD [address] [place],  [place] [address] PCP: Etta Grandchild, MD  Chief Complaint  Patient presents with   Left Thumb - Pain      HPI: Patient is a pleasant 66 year old gentleman who is a patient of Dr. Frazier Butt.  He has seen him in the past for arthritis of his left [facility] joint of the thumb and for Dupuytren's contractures of the right hand.  He was prescribed 7.5 mg of meloxicam and is done quite well with this.  He had numbness and tingling in his left thumb and it is all but resolved without medication.  He is asking if he can get a 90-day supply through his mail order pharmacy supplier.  He works as a Water engineer: Visit Diagnoses:  1. Dupuytren's contracture of right hand   2. 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  Called in to [facility].  May follow-up as needed.  If he understands if he does not have improvement or if he has continuing symptoms he may need to consult with a hand surgeon  Follow-Up Instructions: Return if symptoms worsen or fail to improve.   Ortho Exam  Patient is alert, oriented, no adenopathy, well-dressed, normal affect, normal respiratory effort. Bilateral hands his hands are warm with brisk capillary refill pulses are intact.  On the left side he does have some slight pain with manipulation of the first CMC joint.  On the right hand he does have palpable Dupuytren's contractures.  No redness in either hand no cellulitis.  No swelling.  Grip strength is good  Imaging: No results found. No images are attached to the encounter.  Labs: No results found for: "HGBA1C", "ESRSEDRATE", "CRP", "LABURIC", "REPTSTATUS", "GRAMSTAIN", "CULT", "LABORGA"   Lab Results   Component Value Date   ALBUMIN 4.8 10/22/2020    No results found for: "MG" No results found for: "VD25OH"  No results found for: "PREALBUMIN"    Latest Ref Rng & Units 10/22/2020    9:16 AM  CBC EXTENDED  WBC 4.0 - 10.5 K/uL 7.2   RBC 4.22 - 5.81 Mil/uL 4.75   Hemoglobin 13.0 - 17.0 g/dL 62.1   HCT 30.8 - 65.7 % 45.3   Platelets 150.0 - 400.0 K/uL 258.0   NEUT# 1.4 - 7.7 K/uL 3.5   Lymph# 0.7 - 4.0 K/uL 2.6      Body mass index is 28.17 kg/m.  Orders:  No orders of the defined types were placed in this encounter.  Meds ordered this encounter  Medications   meloxicam (MOBIC) 7.5 MG tablet    Sig: Take 1 tablet (7.5 mg total) by mouth daily.    Dispense:  90 tablet    Refill:  1     Procedures: No procedures performed  Clinical Data: No additional findings.  ROS:  All other systems negative, except as noted in the HPI. Review of Systems  Objective: Vital Signs: Ht 5\' 11"  (1.803 m)   Wt 202 lb (91.6 kg)   BMI 28.17 kg/m   Specialty Comments:  No specialty comments available.  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 08/10/2021   Elevated PSA, less than 10 ng/ml 06/28/2021   Colon cancer screening 06/28/2021   Hearing loss due to cerumen impaction, bilateral 06/28/2021   Dupuytren's contracture of right hand 06/28/2021   Foraminal stenosis of cervical region 10/23/2020   Need for vaccination 10/23/2020   Mixed simple and mucopurulent chronic bronchitis ([facility]) 10/22/2020   Tobacco abuse 10/22/2020   Erectile dysfunction due to arterial insufficiency 10/22/2020   Past Medical History:  Diagnosis Date   Acute lateral meniscus tear of right knee    Asthma    Bronchitis    Heart murmur     Family History  Problem Relation Age of Onset   Stroke Mother    Arthritis Mother    COPD Father    Alcohol abuse Father    Heart disease Brother    Drug abuse  Brother    COPD Brother    Cancer Maternal Grandmother    Arthritis Maternal Grandmother    Heart attack Maternal Grandfather     Past Surgical History:  Procedure Laterality Date   TUMOR REMOVAL     Social History   Occupational History   Not on file  Tobacco Use   Smoking status: Every Day    Packs/day: 2.00    Years: 40.00    Total pack years: 80.00    Types: Cigarettes   Smokeless tobacco: Never  Vaping Use   Vaping Use: Never used  Substance and Sexual Activity   Alcohol use: Never   Drug use: Never   Sexual activity: Yes    Partners: Female

## 2022-01-04 ENCOUNTER — Other Ambulatory Visit: Payer: Self-pay | Admitting: Physician Assistant

## 2022-01-04 ENCOUNTER — Telehealth: Payer: Self-pay | Admitting: Physician Assistant

## 2022-01-04 MED ORDER — MELOXICAM 7.5 MG PO TABS
7.5000 mg | ORAL_TABLET | Freq: Every day | ORAL | 0 refills | Status: DC
Start: 2022-01-04 — End: 2022-01-10

## 2022-01-04 NOTE — Telephone Encounter (Signed)
Pt would like meloxicam  7.5 sent in to pharmacy at [facility] [address]

## 2022-01-10 ENCOUNTER — Ambulatory Visit: Payer: Managed Care, Other (non HMO) | Admitting: Internal Medicine

## 2022-01-10 ENCOUNTER — Other Ambulatory Visit: Payer: Self-pay | Admitting: Physician Assistant

## 2022-01-10 MED ORDER — MELOXICAM 7.5 MG PO TABS: 7.5000 mg | ORAL_TABLET | Freq: Every day | ORAL | 0 refills | Status: DC

## 2022-01-10 NOTE — Telephone Encounter (Signed)
Pt called for status update on his medication, pharmacy states they have not received the refill request

## 2022-02-07 ENCOUNTER — Other Ambulatory Visit: Payer: Self-pay | Admitting: Physician Assistant

## 2022-02-07 ENCOUNTER — Telehealth: Payer: Self-pay | Admitting: Physician Assistant

## 2022-02-07 ENCOUNTER — Other Ambulatory Visit: Payer: Self-pay | Admitting: Internal Medicine

## 2022-02-07 ENCOUNTER — Telehealth: Payer: Self-pay | Admitting: Internal Medicine

## 2022-02-07 DIAGNOSIS — M4802 Spinal stenosis, cervical region: Secondary | ICD-10-CM

## 2022-02-07 MED ORDER — MELOXICAM 7.5 MG PO TABS: 7.5000 mg | ORAL_TABLET | Freq: Every day | ORAL | 0 refills | Status: DC

## 2022-02-07 NOTE — Telephone Encounter (Signed)
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

## 2022-02-07 NOTE — Telephone Encounter (Signed)
Caller & Relationship to patient: Self   Call back number: [phone]   Date of last office visit: 06-28-21  Date of next office visit: 02-21-22  Medication(s) to be refilled:  meloxicam (MOBIC) 7.5 MG tablet    Preferred Pharmacy:  [facility] [phone]   Phone: [phone]  Fax: [phone]    Pt is having trouble getting this RX from ortho care.   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

## 2022-02-08 ENCOUNTER — Telehealth: Payer: Self-pay

## 2022-02-08 NOTE — Telephone Encounter (Signed)
Left VM for patient to call to schedule annual LDCT 

## 2022-02-14 ENCOUNTER — Other Ambulatory Visit: Payer: Self-pay | Admitting: Internal Medicine

## 2022-02-14 DIAGNOSIS — I7121 Aneurysm of the ascending aorta, without rupture: Secondary | ICD-10-CM

## 2022-02-21 ENCOUNTER — Ambulatory Visit: Payer: Managed Care, Other (non HMO) | Admitting: Internal Medicine

## 2022-02-21 ENCOUNTER — Encounter: Payer: Self-pay | Admitting: Internal Medicine

## 2022-02-21 VITALS — BP 126/82 | HR 69 | Temp 98.5°F | Ht 71.0 in | Wt 206.0 lb

## 2022-02-21 DIAGNOSIS — J418 Mixed simple and mucopurulent chronic bronchitis: Secondary | ICD-10-CM

## 2022-02-21 DIAGNOSIS — Z0001 Encounter for general adult medical examination with abnormal findings: Secondary | ICD-10-CM | POA: Diagnosis not present

## 2022-02-21 DIAGNOSIS — E785 Hyperlipidemia, unspecified: Secondary | ICD-10-CM | POA: Diagnosis not present

## 2022-02-21 DIAGNOSIS — R972 Elevated prostate specific antigen [PSA]: Secondary | ICD-10-CM

## 2022-02-21 DIAGNOSIS — I7121 Aneurysm of the ascending aorta, without rupture: Secondary | ICD-10-CM

## 2022-02-21 DIAGNOSIS — Z23 Encounter for immunization: Secondary | ICD-10-CM

## 2022-02-21 LAB — CBC WITH DIFFERENTIAL/PLATELET
Basophils Absolute: 0.1 10*3/uL (ref 0.0–0.1)
Basophils Relative: 1.1 % (ref 0.0–3.0)
Eosinophils Absolute: 0.4 10*3/uL (ref 0.0–0.7)
Eosinophils Relative: 5.5 % — ABNORMAL HIGH (ref 0.0–5.0)
HCT: 43.2 % (ref 39.0–52.0)
Hemoglobin: 14.2 g/dL (ref 13.0–17.0)
Lymphocytes Relative: 43.2 % (ref 12.0–46.0)
Lymphs Abs: 2.9 10*3/uL (ref 0.7–4.0)
MCHC: 32.8 g/dL (ref 30.0–36.0)
MCV: 95.4 fl (ref 78.0–100.0)
Monocytes Absolute: 0.7 10*3/uL (ref 0.1–1.0)
Monocytes Relative: 9.9 % (ref 3.0–12.0)
Neutro Abs: 2.7 10*3/uL (ref 1.4–7.7)
Neutrophils Relative %: 40.3 % — ABNORMAL LOW (ref 43.0–77.0)
Platelets: 268 10*3/uL (ref 150.0–400.0)
RBC: 4.53 Mil/uL (ref 4.22–5.81)
RDW: 13.4 % (ref 11.5–15.5)
WBC: 6.6 10*3/uL (ref 4.0–10.5)

## 2022-02-21 LAB — LIPID PANEL
Cholesterol: 161 mg/dL (ref 0–200)
HDL: 53.9 mg/dL (ref >39.00)
LDL Cholesterol: 80 mg/dL (ref 0–99)
NonHDL: 107.38
Total CHOL/HDL Ratio: 3
Triglycerides: 137 mg/dL (ref 0.0–149.0)
VLDL: 27.4 mg/dL (ref 0.0–40.0)

## 2022-02-21 LAB — HEPATIC FUNCTION PANEL
ALT: 20 U/L (ref 0–53)
AST: 28 U/L (ref 0–37)
Albumin: 4.4 g/dL (ref 3.5–5.2)
Alkaline Phosphatase: 78 U/L (ref 39–117)
Bilirubin, Direct: 0.1 mg/dL (ref 0.0–0.3)
Total Bilirubin: 0.5 mg/dL (ref 0.2–1.2)
Total Protein: 7.2 g/dL (ref 6.0–8.3)

## 2022-02-21 LAB — BASIC METABOLIC PANEL
BUN: 13 mg/dL (ref 6–23)
CO2: 30 mEq/L (ref 19–32)
Calcium: 9.4 mg/dL (ref 8.4–10.5)
Chloride: 104 mEq/L (ref 96–112)
Creatinine, Ser: 1.19 mg/dL (ref 0.40–1.50)
GFR: 63.73 mL/min (ref >60.00)
Glucose, Bld: 73 mg/dL (ref 70–99)
Potassium: 4 mEq/L (ref 3.5–5.1)
Sodium: 140 mEq/L (ref 135–145)

## 2022-02-21 LAB — TSH: TSH: 0.57 u[IU]/mL (ref 0.35–5.50)

## 2022-02-21 MED ORDER — TRELEGY ELLIPTA 100-62.5-25 MCG/ACT IN AEPB: 1.0000 | INHALATION_SPRAY | Freq: Every day | RESPIRATORY_TRACT | 1 refills | Status: DC

## 2022-02-21 NOTE — Progress Notes (Signed)
Subjective:  Patient ID: Tyler Zuniga, male    DOB: 01-06-1956  Age: 66 y.o. MRN: [id number]  CC: Annual Exam and COPD   HPI Tyler Zuniga presents for a CPX and f/up -   Outpatient Medications Prior to Visit  Medication Sig Dispense Refill   finasteride (PROSCAR) 5 MG tablet Take 5 mg by mouth daily.     meloxicam (MOBIC) 7.5 MG tablet Take 1 tablet (7.5 mg total) by mouth daily. 90 tablet 0   albuterol (VENTOLIN HFA) 108 (90 Base) MCG/ACT inhaler TAKE 2 PUFFS BY MOUTH EVERY 6 HOURS AS NEEDED FOR WHEEZE OR SHORTNESS OF BREATH 18 each 3   ANORO ELLIPTA 62.5-25 MCG/ACT AEPB Inhale 1 puff into the lungs daily.     Fluticasone-Umeclidin-Vilant (TRELEGY ELLIPTA) 100-62.5-25 MCG/ACT AEPB Inhale 1 puff into the lungs daily. 120 each 1   vardenafil (LEVITRA) 20 MG tablet Take 1 tablet (20 mg total) by mouth daily as needed for erectile dysfunction. 15 tablet 2   No facility-administered medications prior to visit.    ROS Review of Systems  Constitutional: Negative.  Negative for appetite change, diaphoresis, fatigue and unexpected weight change.  HENT: Negative.    Eyes:  Negative for visual disturbance.  Respiratory:  Positive for cough. Negative for chest tightness, shortness of breath and wheezing.   Cardiovascular:  Negative for chest pain, palpitations and leg swelling.  Gastrointestinal:  Negative for abdominal pain, constipation, diarrhea, nausea and vomiting.  Endocrine: Negative.   Genitourinary:  Negative for difficulty urinating.  Musculoskeletal:  Positive for arthralgias. Negative for myalgias.  Skin: Negative.   Neurological:  Negative for dizziness, weakness and light-headedness.  Hematological:  Negative for adenopathy. Does not bruise/bleed easily.  Psychiatric/Behavioral: Negative.      Objective:  BP 126/82 (BP Location: Left Arm, Patient Position: Sitting, Cuff Size: Large)   Pulse 69   Temp 98.5 F (36.9 C) (Oral)   Ht 5\' 11"  (1.803 m)   Wt 206 lb  (93.4 kg)   SpO2 98%   BMI 28.73 kg/m   BP Readings from Last 3 Encounters:  02/21/22 126/82  08/10/21 130/84  08/10/21 124/86    Wt Readings from Last 3 Encounters:  02/21/22 206 lb (93.4 kg)  11/29/21 202 lb (91.6 kg)  08/10/21 201 lb (91.2 kg)    Physical Exam Vitals reviewed.  Constitutional:      Appearance: He is not ill-appearing.  HENT:     Nose: Nose normal.     Mouth/Throat:     Mouth: Mucous membranes are moist.  Eyes:     General: No scleral icterus.    Conjunctiva/sclera: Conjunctivae normal.  Cardiovascular:     Rate and Rhythm: Normal rate and regular rhythm.     Heart sounds: No murmur heard. Pulmonary:     Effort: Pulmonary effort is normal.     Breath sounds: No stridor. No wheezing, rhonchi or rales.  Abdominal:     General: Abdomen is flat.     Palpations: There is no mass.     Tenderness: There is no abdominal tenderness. There is no guarding.     Hernia: No hernia is present.  Musculoskeletal:        General: Normal range of motion.     Cervical back: Neck supple.     Right lower leg: No edema.     Left lower leg: No edema.  Lymphadenopathy:     Cervical: No cervical adenopathy.  Skin:    General: Skin is warm  and dry.  Neurological:     General: No focal deficit present.     Mental Status: He is alert.  Psychiatric:        Mood and Affect: Mood normal.        Behavior: Behavior normal.     Lab Results  Component Value Date   WBC 6.6 02/21/2022   HGB 14.2 02/21/2022   HCT 43.2 02/21/2022   PLT 268.0 02/21/2022   GLUCOSE 73 02/21/2022   CHOL 161 02/21/2022   TRIG 137.0 02/21/2022   HDL 53.90 02/21/2022   LDLCALC 80 02/21/2022   ALT 20 02/21/2022   AST 28 02/21/2022   NA 140 02/21/2022   K 4.0 02/21/2022   CL 104 02/21/2022   CREATININE 1.19 02/21/2022   BUN 13 02/21/2022   CO2 30 02/21/2022   TSH 0.57 02/21/2022   PSA 2.4 11/29/2021    CT CHEST LUNG CA SCREEN LOW DOSE W/O CM  Result Date: 03/10/2021 CLINICAL DATA:   66 year old asymptomatic male current smoker with 112 pack-year smoking history. EXAM: CT CHEST WITHOUT CONTRAST LOW-DOSE FOR LUNG CANCER SCREENING TECHNIQUE: Multidetector CT imaging of the chest was performed following the standard protocol without IV contrast. COMPARISON:  None. FINDINGS: Cardiovascular: Normal heart size. No significant pericardial effusion/thickening. Atherosclerotic thoracic aorta with dilated 4.2 cm ascending thoracic aorta. Top-normal caliber main pulmonary artery (3.4 cm diameter). Mediastinum/Nodes: No discrete thyroid nodules. Unremarkable esophagus. No pathologically enlarged axillary, mediastinal or hilar lymph nodes, noting limited sensitivity for the detection of hilar adenopathy on this noncontrast study. Lungs/Pleura: No pneumothorax. No pleural effusion. Moderate centrilobular emphysema with diffuse bronchial wall thickening. No acute consolidative airspace disease or lung masses. A few scattered tiny calcified bilateral pulmonary granulomas. No significant pulmonary nodules. Upper abdomen: No acute abnormality. Musculoskeletal: No aggressive appearing focal osseous lesions. Mild thoracic spondylosis. IMPRESSION: Lung-RADS 1, negative. Continue annual screening with low-dose chest CT without contrast in 12 months. Dilated 4.2 cm ascending thoracic aorta, which can be reassessed on follow-up screening chest CT in 12 months. Aortic Atherosclerosis (ICD10-I70.0) and Emphysema (ICD10-J43.9). Electronically Signed   By: Delbert Phenix M.D.   On: 03/10/2021 08:45   Assessment & Plan:   Tyler Zuniga was seen today for annual exam and copd.  Diagnoses and all orders for this visit:  Mixed simple and mucopurulent chronic bronchitis (HCC) -     Fluticasone-Umeclidin-Vilant (TRELEGY ELLIPTA) 100-62.5-25 MCG/ACT AEPB; Inhale 1 puff into the lungs daily. -     CBC with Differential/Platelet; Future -     CBC with Differential/Platelet  Aneurysm of ascending aorta without rupture  (HCC) -     Cancel: CT ANGIO CHEST AORTA W/ & OR WO/CM & GATING ([facility] ONLY); Future  Dyslipidemia, goal LDL below 100 -     Lipid panel; Future -     Basic metabolic panel; Future -     Hepatic function panel; Future -     TSH; Future -     TSH -     Hepatic function panel -     Basic metabolic panel -     Lipid panel  Flu vaccine need -     Flu Vaccine QUAD High Dose(Fluad)  Encounter for general adult medical examination with abnormal findings  Need for prophylactic vaccination and inoculation against varicella -     Zoster Vaccine Adjuvanted [facility]) injection; Inject 0.5 mLs into the muscle once for 1 dose.   I have discontinued Tyler Zuniga's vardenafil, Anoro Ellipta, and albuterol. I am  also having him start on Shingrix. Additionally, I am having him maintain his finasteride, meloxicam, and Trelegy Ellipta.  Meds ordered this encounter  Medications   Fluticasone-Umeclidin-Vilant (TRELEGY ELLIPTA) 100-62.5-25 MCG/ACT AEPB    Sig: Inhale 1 puff into the lungs daily.    Dispense:  120 each    Refill:  1   Zoster Vaccine Adjuvanted [facility]) injection    Sig: Inject 0.5 mLs into the muscle once for 1 dose.    Dispense:  0.5 mL    Refill:  1     Follow-up: Return in about 6 months (around 08/23/2022).  Sanda Linger, MD

## 2022-02-21 NOTE — Patient Instructions (Signed)
Health Maintenance, Male Adopting a healthy lifestyle and getting preventive care are important in promoting health and wellness. Ask your health care provider about: The right schedule for you to have regular tests and exams. Things you can do on your own to prevent diseases and keep yourself healthy. What should I know about diet, weight, and exercise? Eat a healthy diet  Eat a diet that includes plenty of vegetables, fruits, low-fat dairy products, and lean protein. Do not eat a lot of foods that are high in solid fats, added sugars, or sodium. Maintain a healthy weight Body mass index (BMI) is a measurement that can be used to identify possible weight problems. It estimates body fat based on height and weight. Your health care provider can help determine your BMI and help you achieve or maintain a healthy weight. Get regular exercise Get regular exercise. This is one of the most important things you can do for your health. Most adults should: Exercise for at least 150 minutes each week. The exercise should increase your heart rate and make you sweat (moderate-intensity exercise). Do strengthening exercises at least twice a week. This is in addition to the moderate-intensity exercise. Spend less time sitting. Even light physical activity can be beneficial. Watch cholesterol and blood lipids Have your blood tested for lipids and cholesterol at 66 years of age, then have this test every 5 years. You may need to have your cholesterol levels checked more often if: Your lipid or cholesterol levels are high. You are older than 66 years of age. You are at high risk for heart disease. What should I know about cancer screening? Many types of cancers can be detected early and may often be prevented. Depending on your health history and family history, you may need to have cancer screening at various ages. This may include screening for: Colorectal cancer. Prostate cancer. Skin cancer. Lung  cancer. What should I know about heart disease, diabetes, and high blood pressure? Blood pressure and heart disease High blood pressure causes heart disease and increases the risk of stroke. This is more likely to develop in people who have high blood pressure readings or are overweight. Talk with your health care provider about your target blood pressure readings. Have your blood pressure checked: Every 3-5 years if you are 18-39 years of age. Every year if you are 40 years old or older. If you are between the ages of 65 and 75 and are a current or former smoker, ask your health care provider if you should have a one-time screening for abdominal aortic aneurysm (AAA). Diabetes Have regular diabetes screenings. This checks your fasting blood sugar level. Have the screening done: Once every three years after age 45 if you are at a normal weight and have a low risk for diabetes. More often and at a younger age if you are overweight or have a high risk for diabetes. What should I know about preventing infection? Hepatitis B If you have a higher risk for hepatitis B, you should be screened for this virus. Talk with your health care provider to find out if you are at risk for hepatitis B infection. Hepatitis C Blood testing is recommended for: Everyone born from 1945 through 1965. Anyone with known risk factors for hepatitis C. Sexually transmitted infections (STIs) You should be screened each year for STIs, including gonorrhea and chlamydia, if: You are sexually active and are younger than 66 years of age. You are older than 66 years of age and your   health care provider tells you that you are at risk for this type of infection. Your sexual activity has changed since you were last screened, and you are at increased risk for chlamydia or gonorrhea. Ask your health care provider if you are at risk. Ask your health care provider about whether you are at high risk for HIV. Your health care provider  may recommend a prescription medicine to help prevent HIV infection. If you choose to take medicine to prevent HIV, you should first get tested for HIV. You should then be tested every 3 months for as long as you are taking the medicine. Follow these instructions at home: Alcohol use Do not drink alcohol if your health care provider tells you not to drink. If you drink alcohol: Limit how much you have to 0-2 drinks a day. Know how much alcohol is in your drink. In the [place] one drink equals one 12 oz bottle of beer (355 mL), one 5 oz glass of wine (148 mL), or one 1 oz glass of hard liquor (44 mL). Lifestyle Do not use any products that contain nicotine or tobacco. These products include cigarettes, chewing tobacco, and vaping devices, such as e-cigarettes. If you need help quitting, ask your health care provider. Do not use street drugs. Do not share needles. Ask your health care provider for help if you need support or information about quitting drugs. General instructions Schedule regular health, dental, and eye exams. Stay current with your vaccines. Tell your health care provider if: You often feel depressed. You have ever been abused or do not feel safe at home. Summary Adopting a healthy lifestyle and getting preventive care are important in promoting health and wellness. Follow your health care provider's instructions about healthy diet, exercising, and getting tested or screened for diseases. Follow your health care provider's instructions on monitoring your cholesterol and blood pressure. This information is not intended to replace advice given to you by your health care provider. Make sure you discuss any questions you have with your health care provider. Document Revised: 07/20/2020 Document Reviewed: 07/20/2020 Elsevier Patient Education  2023 Elsevier Inc.  

## 2022-02-24 ENCOUNTER — Other Ambulatory Visit: Payer: Self-pay | Admitting: Internal Medicine

## 2022-02-24 DIAGNOSIS — J418 Mixed simple and mucopurulent chronic bronchitis: Secondary | ICD-10-CM

## 2022-02-27 DIAGNOSIS — Z0001 Encounter for general adult medical examination with abnormal findings: Secondary | ICD-10-CM | POA: Insufficient documentation

## 2022-02-27 DIAGNOSIS — Z23 Encounter for immunization: Secondary | ICD-10-CM | POA: Insufficient documentation

## 2022-02-27 MED ORDER — SHINGRIX 50 MCG/0.5ML IM SUSR: 0.5000 mL | Freq: Once | INTRAMUSCULAR | 1 refills | Status: AC

## 2022-02-28 MED ORDER — ROSUVASTATIN CALCIUM 10 MG PO TABS: 10.0000 mg | ORAL_TABLET | Freq: Every day | ORAL | 1 refills | Status: DC

## 2022-03-10 ENCOUNTER — Telehealth: Payer: Self-pay

## 2022-03-10 NOTE — Telephone Encounter (Signed)
Pt has called and stated he is being charged $94 for the Fluticasone-Umeclidin-Vilant (TRELEGY ELLIPTA) 100-62.5-25 MCG/ACT AEPB. He is wanting to know if something else can be sent in.

## 2022-03-11 ENCOUNTER — Other Ambulatory Visit: Payer: Self-pay | Admitting: Internal Medicine

## 2022-03-11 DIAGNOSIS — J418 Mixed simple and mucopurulent chronic bronchitis: Secondary | ICD-10-CM

## 2022-04-06 ENCOUNTER — Other Ambulatory Visit: Payer: Medicare HMO

## 2022-04-22 ENCOUNTER — Telehealth: Payer: Self-pay

## 2022-04-22 ENCOUNTER — Ambulatory Visit: Payer: Medicare HMO

## 2022-04-22 NOTE — Telephone Encounter (Signed)
Called patient lvm to return call, to complete AWV at [phone].  If no return call within 15 minutes, patient may reschedule for the next available appointment with [facility] or CMA. -S. Ariyona Eid,LPN 

## 2022-04-27 ENCOUNTER — Telehealth: Payer: Self-pay | Admitting: Internal Medicine

## 2022-04-27 NOTE — Telephone Encounter (Signed)
Patient called requesting to get back on his medication ANORO ELLIPTA 62.5-25 MCG/ACT AEPB [id number]  DISCONTINUED.   Patient stated that this medication is cheaper and would like this to be sent his pharmacy on file [facility] [place] [place] [facility] [employer].  Best callback number for patient is [phone].

## 2022-04-29 ENCOUNTER — Other Ambulatory Visit: Payer: Self-pay | Admitting: Internal Medicine

## 2022-04-29 DIAGNOSIS — J418 Mixed simple and mucopurulent chronic bronchitis: Secondary | ICD-10-CM

## 2022-04-29 MED ORDER — UMECLIDINIUM-VILANTEROL 62.5-25 MCG/ACT IN AEPB: 1.0000 | INHALATION_SPRAY | Freq: Every day | RESPIRATORY_TRACT | 1 refills | Status: DC

## 2022-05-05 ENCOUNTER — Ambulatory Visit
Admission: RE | Admit: 2022-05-05 | Discharge: 2022-05-05 | Disposition: A | Discharge status: Home / Self Care | Payer: Medicare HMO | Source: Ambulatory Visit | Attending: Internal Medicine | Admitting: Internal Medicine

## 2022-05-05 DIAGNOSIS — J929 Pleural plaque without asbestos: Secondary | ICD-10-CM | POA: Diagnosis not present

## 2022-05-05 DIAGNOSIS — I359 Nonrheumatic aortic valve disorder, unspecified: Secondary | ICD-10-CM | POA: Diagnosis not present

## 2022-05-05 DIAGNOSIS — J439 Emphysema, unspecified: Secondary | ICD-10-CM | POA: Diagnosis not present

## 2022-05-05 DIAGNOSIS — I7121 Aneurysm of the ascending aorta, without rupture: Secondary | ICD-10-CM

## 2022-05-05 DIAGNOSIS — I712 Thoracic aortic aneurysm, without rupture, unspecified: Secondary | ICD-10-CM | POA: Diagnosis not present

## 2022-05-05 MED ORDER — IOPAMIDOL (ISOVUE-370) INJECTION 76%
75.0000 mL | Freq: Once | INTRAVENOUS | Status: AC | PRN
  Administered 2022-05-05: 75 mL via INTRAVENOUS

## 2022-05-06 ENCOUNTER — Other Ambulatory Visit: Payer: Self-pay | Admitting: Internal Medicine

## 2022-05-06 DIAGNOSIS — M4802 Spinal stenosis, cervical region: Secondary | ICD-10-CM

## 2022-05-09 ENCOUNTER — Other Ambulatory Visit: Payer: Self-pay

## 2022-05-09 DIAGNOSIS — F17210 Nicotine dependence, cigarettes, uncomplicated: Secondary | ICD-10-CM

## 2022-05-09 DIAGNOSIS — Z87891 Personal history of nicotine dependence: Secondary | ICD-10-CM

## 2022-05-13 ENCOUNTER — Telehealth: Payer: Self-pay | Admitting: Internal Medicine

## 2022-05-13 ENCOUNTER — Other Ambulatory Visit: Payer: Self-pay | Admitting: Internal Medicine

## 2022-05-13 NOTE — Telephone Encounter (Signed)
Pt called wanted his Rx refilled:  umeclidinium-vilanterol (ANORO ELLIPTA) 62.5-25 MCG/ACT AEPB   Pt also stated this have ben an ongoing process and he really his medication.  Preferred pharmacy:[facility] [place] [place] [facility]istered [facility] [facility]

## 2022-05-13 NOTE — Telephone Encounter (Signed)
Pt has been informed that refill was sent on 2/16. He expressed understanding.

## 2022-07-26 ENCOUNTER — Other Ambulatory Visit: Payer: Self-pay | Admitting: Internal Medicine

## 2022-07-26 DIAGNOSIS — J418 Mixed simple and mucopurulent chronic bronchitis: Secondary | ICD-10-CM

## 2022-08-22 DIAGNOSIS — R21 Rash and other nonspecific skin eruption: Secondary | ICD-10-CM | POA: Diagnosis not present

## 2022-08-26 ENCOUNTER — Other Ambulatory Visit: Payer: Self-pay | Admitting: Internal Medicine

## 2022-08-26 DIAGNOSIS — M4802 Spinal stenosis, cervical region: Secondary | ICD-10-CM

## 2022-08-31 DIAGNOSIS — K921 Melena: Secondary | ICD-10-CM | POA: Diagnosis not present

## 2022-08-31 DIAGNOSIS — L0390 Cellulitis, unspecified: Secondary | ICD-10-CM | POA: Diagnosis not present

## 2022-10-03 ENCOUNTER — Encounter: Payer: Self-pay | Admitting: *Deleted

## 2022-10-04 ENCOUNTER — Other Ambulatory Visit: Payer: Self-pay | Admitting: Internal Medicine

## 2022-10-04 DIAGNOSIS — M4802 Spinal stenosis, cervical region: Secondary | ICD-10-CM

## 2022-11-03 ENCOUNTER — Inpatient Hospital Stay: Admission: RE | Admit: 2022-11-03 | Payer: Medicare HMO | Source: Ambulatory Visit

## 2023-01-03 ENCOUNTER — Other Ambulatory Visit: Payer: Self-pay | Admitting: Internal Medicine

## 2023-01-03 DIAGNOSIS — M4802 Spinal stenosis, cervical region: Secondary | ICD-10-CM

## 2023-03-20 ENCOUNTER — Other Ambulatory Visit: Payer: Self-pay | Admitting: Internal Medicine

## 2023-03-20 DIAGNOSIS — I7121 Aneurysm of the ascending aorta, without rupture: Secondary | ICD-10-CM

## 2023-04-17 ENCOUNTER — Other Ambulatory Visit: Payer: Self-pay | Admitting: Internal Medicine

## 2023-04-17 ENCOUNTER — Telehealth: Payer: Self-pay | Admitting: Internal Medicine

## 2023-04-17 DIAGNOSIS — Z720 Tobacco use: Secondary | ICD-10-CM

## 2023-04-17 NOTE — Telephone Encounter (Signed)
Copied from CRM [id number]. Topic: Clinical - Request for Lab/Test Order >> Apr 17, 2023  9:26 AM Marica Otter wrote: Reason for CRM: Office is calling to have a new order sent for patient for CT chest screening, order expires today and patients appointment is February 6th. If order can be uploaded into the patients chart they can retrieve it.  Claris Gower [phone] Ext [phone]

## 2023-04-18 ENCOUNTER — Telehealth: Payer: Self-pay

## 2023-04-18 NOTE — Telephone Encounter (Signed)
Copied from CRM [id number]. Topic: General - Other >> Apr 18, 2023  4:13 PM Truddie Crumble wrote: Reason for CRM: Claris Gower from [facility] called checking on the status of an order

## 2023-04-19 ENCOUNTER — Telehealth: Payer: Self-pay

## 2023-04-19 NOTE — Telephone Encounter (Signed)
 Copied from CRM [id number]. Topic: Clinical - Request for Lab/Test Order >> Apr 19, 2023 10:01 AM Curlee DEL wrote: Reason for CRM: Roselie from [facility] is calling to request the authorization for the CT scan - they recently discovered that the patient has two L-3 communications - they only have authorization for one of the polices. Please call her as the patient is scheduled tomorrow for this scan @ [phone] ext. [phone].  ---  Please verify with both ins policies

## 2023-04-19 NOTE — Telephone Encounter (Signed)
 Copied from CRM [id number]. Topic: Clinical - Medical Advice >> Apr 18, 2023 11:53 AM Lovett Ruck C wrote: Reason for CRM: patient's medical authorization expired and they need a new one for his procedure on 04/20/2023. Please advise. Thank you

## 2023-04-20 ENCOUNTER — Inpatient Hospital Stay: Admission: RE | Admit: 2023-04-20 | Payer: Medicare HMO | Source: Ambulatory Visit

## 2023-04-20 NOTE — Telephone Encounter (Signed)
 Sent a message to referral coordinator in regards to completing another authorization for patient.

## 2023-04-20 NOTE — Telephone Encounter (Signed)
 Patient needing another authorization for CT Scans, procedure today was cancelled due to previous one expiring.

## 2023-04-26 ENCOUNTER — Encounter: Payer: Self-pay | Admitting: Internal Medicine

## 2023-05-19 ENCOUNTER — Ambulatory Visit
Admission: RE | Admit: 2023-05-19 | Discharge: 2023-05-19 | Disposition: A | Discharge status: Home / Self Care | Payer: Managed Care, Other (non HMO) | Source: Ambulatory Visit | Attending: Internal Medicine | Admitting: Internal Medicine

## 2023-05-19 DIAGNOSIS — I7121 Aneurysm of the ascending aorta, without rupture: Secondary | ICD-10-CM

## 2023-05-19 MED ORDER — IOPAMIDOL (ISOVUE-370) INJECTION 76%
500.0000 mL | Freq: Once | INTRAVENOUS | Status: AC | PRN
  Administered 2023-05-19: 75 mL via INTRAVENOUS

## 2023-05-23 ENCOUNTER — Encounter: Payer: Self-pay | Admitting: Internal Medicine

## 2023-06-13 ENCOUNTER — Encounter: Payer: Self-pay | Admitting: Gastroenterology

## 2023-06-13 ENCOUNTER — Encounter: Payer: Self-pay | Admitting: Emergency Medicine

## 2023-06-13 ENCOUNTER — Ambulatory Visit (INDEPENDENT_AMBULATORY_CARE_PROVIDER_SITE_OTHER): Payer: Managed Care, Other (non HMO)

## 2023-06-13 ENCOUNTER — Ambulatory Visit: Admitting: Emergency Medicine

## 2023-06-13 VITALS — BP 130/88 | HR 68 | Temp 98.6°F | Ht 70.0 in | Wt 201.0 lb

## 2023-06-13 VITALS — BP 128/72 | HR 68 | Ht 70.0 in | Wt 201.6 lb

## 2023-06-13 DIAGNOSIS — R6881 Early satiety: Secondary | ICD-10-CM

## 2023-06-13 DIAGNOSIS — Z1159 Encounter for screening for other viral diseases: Secondary | ICD-10-CM

## 2023-06-13 DIAGNOSIS — M79641 Pain in right hand: Secondary | ICD-10-CM | POA: Diagnosis not present

## 2023-06-13 DIAGNOSIS — M19041 Primary osteoarthritis, right hand: Secondary | ICD-10-CM

## 2023-06-13 DIAGNOSIS — R1013 Epigastric pain: Secondary | ICD-10-CM | POA: Diagnosis not present

## 2023-06-13 DIAGNOSIS — Z0000 Encounter for general adult medical examination without abnormal findings: Secondary | ICD-10-CM

## 2023-06-13 DIAGNOSIS — M19042 Primary osteoarthritis, left hand: Secondary | ICD-10-CM

## 2023-06-13 DIAGNOSIS — M4802 Spinal stenosis, cervical region: Secondary | ICD-10-CM | POA: Diagnosis not present

## 2023-06-13 DIAGNOSIS — F17200 Nicotine dependence, unspecified, uncomplicated: Secondary | ICD-10-CM | POA: Insufficient documentation

## 2023-06-13 DIAGNOSIS — Z0120 Encounter for dental examination and cleaning without abnormal findings: Secondary | ICD-10-CM

## 2023-06-13 DIAGNOSIS — Z0100 Encounter for examination of eyes and vision without abnormal findings: Secondary | ICD-10-CM

## 2023-06-13 LAB — CBC WITH DIFFERENTIAL/PLATELET
Basophils Absolute: 0.1 10*3/uL (ref 0.0–0.1)
Basophils Relative: 1 % (ref 0.0–3.0)
Eosinophils Absolute: 0.2 10*3/uL (ref 0.0–0.7)
Eosinophils Relative: 4.2 % (ref 0.0–5.0)
HCT: 42.6 % (ref 39.0–52.0)
Hemoglobin: 13.9 g/dL (ref 13.0–17.0)
Lymphocytes Relative: 45.4 % (ref 12.0–46.0)
Lymphs Abs: 2.5 10*3/uL (ref 0.7–4.0)
MCHC: 32.7 g/dL (ref 30.0–36.0)
MCV: 93.5 fl (ref 78.0–100.0)
Monocytes Absolute: 0.6 10*3/uL (ref 0.1–1.0)
Monocytes Relative: 11.3 % (ref 3.0–12.0)
Neutro Abs: 2.1 10*3/uL (ref 1.4–7.7)
Neutrophils Relative %: 38.1 % — ABNORMAL LOW (ref 43.0–77.0)
Platelets: 295 10*3/uL (ref 150.0–400.0)
RBC: 4.55 Mil/uL (ref 4.22–5.81)
RDW: 12.8 % (ref 11.5–15.5)
WBC: 5.5 10*3/uL (ref 4.0–10.5)

## 2023-06-13 LAB — COMPREHENSIVE METABOLIC PANEL WITH GFR
ALT: 20 U/L (ref 0–53)
AST: 22 U/L (ref 0–37)
Albumin: 4.9 g/dL (ref 3.5–5.2)
Alkaline Phosphatase: 91 U/L (ref 39–117)
BUN: 14 mg/dL (ref 6–23)
CO2: 28 meq/L (ref 19–32)
Calcium: 9.5 mg/dL (ref 8.4–10.5)
Chloride: 100 meq/L (ref 96–112)
Creatinine, Ser: 1.17 mg/dL (ref 0.40–1.50)
GFR: 64.44 mL/min (ref >60.00)
Glucose, Bld: 86 mg/dL (ref 70–99)
Potassium: 4 meq/L (ref 3.5–5.1)
Sodium: 137 meq/L (ref 135–145)
Total Bilirubin: 0.6 mg/dL (ref 0.2–1.2)
Total Protein: 8.3 g/dL (ref 6.0–8.3)

## 2023-06-13 LAB — LIPASE: Lipase: 15 U/L (ref 11.0–59.0)

## 2023-06-13 MED ORDER — PANTOPRAZOLE SODIUM 40 MG PO TBEC: 40.0000 mg | DELAYED_RELEASE_TABLET | Freq: Every day | ORAL | 3 refills | Status: DC

## 2023-06-13 MED ORDER — MELOXICAM 7.5 MG PO TABS: 7.5000 mg | ORAL_TABLET | Freq: Every day | ORAL | 0 refills | Status: DC

## 2023-06-13 NOTE — Progress Notes (Signed)
 Subjective:   Tyler Zuniga is a 68 y.o. who presents for a Medicare Wellness preventive visit.  Visit Complete: In person  Persons Participating in Visit: Patient.  AWV Questionnaire: No: Patient Medicare AWV questionnaire was not completed prior to this visit.  Cardiac Risk Factors include: advanced age (>61men, >68 women);dyslipidemia;male gender     Objective:    Today's Vitals   06/13/23 0931  BP: 128/72  Pulse: 68  Weight: 201 lb 9.6 oz (91.4 kg)  Height: 5\' 10"  (1.778 m)  PainSc: 8    Body mass index is 28.93 kg/m.     06/13/2023    9:29 AM  Advanced Directives  Does Patient Have a Medical Advance Directive? No  Would patient like information on creating a medical advance directive? Yes (MAU/Ambulatory/Procedural Areas - Information given)    Current Medications (verified) Outpatient Encounter Medications as of 06/13/2023  Medication Sig   albuterol (VENTOLIN HFA) 108 (90 Base) MCG/ACT inhaler TAKE 2 PUFFS BY MOUTH EVERY 6 HOURS AS NEEDED FOR WHEEZE OR SHORTNESS OF BREATH   finasteride (PROSCAR) 5 MG tablet Take 5 mg by mouth daily.   ibuprofen (ADVIL) 200 MG tablet Take 200 mg by mouth every 6 (six) hours as needed for moderate pain (pain score 4-6).   meloxicam (MOBIC) 7.5 MG tablet TAKE 1 TABLET BY MOUTH EVERY DAY   rosuvastatin (CRESTOR) 10 MG tablet Take 1 tablet (10 mg total) by mouth daily.   umeclidinium-vilanterol (ANORO ELLIPTA) 62.5-25 MCG/ACT AEPB Inhale 1 puff into the lungs daily at 6 (six) AM.   No facility-administered encounter medications on file as of 06/13/2023.    Allergies (verified) Penicillin g   History: Past Medical History:  Diagnosis Date   Acute lateral meniscus tear of right knee    Asthma    Bronchitis    Heart murmur    Past Surgical History:  Procedure Laterality Date   TUMOR REMOVAL     Family History  Problem Relation Age of Onset   Stroke Mother    Arthritis Mother    COPD Father    Alcohol abuse Father     Heart disease Brother    Drug abuse Brother    COPD Brother    Cancer Maternal Grandmother    Arthritis Maternal Grandmother    Heart attack Maternal Grandfather    Social History   Socioeconomic History   Marital status: Media planner    Spouse name: Not on file   Number of children: Not on file   Years of education: Not on file   Highest education level: Not on file  Occupational History   Not on file  Tobacco Use   Smoking status: Every Day    Current packs/day: 2.00    Average packs/day: 2.0 packs/day for 40.0 years (80.0 ttl pk-yrs)    Types: Cigarettes    Passive exposure: Past   Smokeless tobacco: Never  Vaping Use   Vaping status: Never Used  Substance and Sexual Activity   Alcohol use: Never   Drug use: Never   Sexual activity: Yes    Partners: Female  Other Topics Concern   Not on file  Social History Narrative   Not on file   Social Drivers of Health   Financial Resource Strain: Low Risk  (06/13/2023)   Overall Financial Resource Strain (CARDIA)    Difficulty of Paying Living Expenses: Not hard at all  Food Insecurity: No Food Insecurity (06/13/2023)   Hunger Vital Sign    Worried  About Running Out of Food in the Last Year: Never true    Ran Out of Food in the Last Year: Never true  Transportation Needs: No Transportation Needs (06/13/2023)   PRAPARE - Administrator, Civil Service (Medical): No    Lack of Transportation (Non-Medical): No  Physical Activity: Sufficiently Active (06/13/2023)   Exercise Vital Sign    Days of Exercise per Week: 4 days    Minutes of Exercise per Session: 60 min  Stress: No Stress Concern Present (06/13/2023)   Tyler Zuniga of Occupational Health - Occupational Stress Questionnaire    Feeling of Stress : Not at all  Social Connections: Moderately Integrated (06/13/2023)   Social Connection and Isolation Panel [NHANES]    Frequency of Communication with Friends and Family: More than three times a week     Frequency of Social Gatherings with Friends and Family: More than three times a week    Attends Religious Services: More than 4 times per year    Active Member of [employer] or Organizations: No    Attends Engineer, structural: Never    Marital Status: Living with partner    Tobacco Counseling Ready to quit: No Counseling given: No    Clinical Intake:  Pre-visit preparation completed: Yes  Pain : 0-10 (back/hand) Pain Score: 8  Pain Type: Acute pain (back/hand) Pain Location: Hand Pain Orientation: Right Pain Descriptors / Indicators: Aching Pain Onset: 1 to 4 weeks ago Pain Frequency: Constant Pain Relieving Factors: Ibuprofen Effect of Pain on Daily Activities: stretching  Pain Relieving Factors: Ibuprofen  BMI - recorded: 28.93 Nutritional Risks: None Diabetes: No  No results found for: "HGBA1C"   How often do you need to have someone help you when you read instructions, pamphlets, or other written materials from your doctor or pharmacy?: 1 - Never  Interpreter Needed?: No  Information entered by :: Hassell Halim, CMA   Activities of Daily Living     06/13/2023    9:37 AM  In your present state of health, do you have any difficulty performing the following activities:  Hearing? 0  Vision? 0  Difficulty concentrating or making decisions? 0  Walking or climbing stairs? 0  Dressing or bathing? 0  Doing errands, shopping? 0  Preparing Food and eating ? N  Using the Toilet? N  In the past six months, have you accidently leaked urine? N  Do you have problems with loss of bowel control? N  Managing your Medications? N  Managing your Finances? N  Housekeeping or managing your Housekeeping? N    Patient Care Team: Etta Grandchild, MD as PCP - General (Internal Medicine)  Indicate any recent Medical Services you may have received from other than [facility] providers in the past year (date may be approximate).     Assessment:   This is a routine wellness  examination for Tyler Zuniga.  Hearing/Vision screen Hearing Screening - Comments:: Denies hearing difficulties   Vision Screening - Comments:: Referral to [facility] - vision difficulties   Goals Addressed               This Visit's Progress     Patient Stated (pt-stated)        Patient stated he wants to stay active.       Depression Screen     06/13/2023    9:42 AM 08/10/2021    9:28 AM 01/06/2021    3:01 PM 10/22/2020    8:12 AM  PHQ  2/9 Scores  PHQ - 2 Score 0 0 6 0  PHQ- 9 Score 3  18     Fall Risk     06/13/2023    9:44 AM 08/10/2021    9:28 AM 01/06/2021    2:52 PM  Fall Risk   Falls in the past year? 0 0 0  Number falls in past yr: 0 0 0  Injury with Fall? 0 0   Risk for fall due to : No Fall Risks    Follow up Falls prevention discussed;Falls evaluation completed      MEDICARE RISK AT HOME:  Medicare Risk at Home Any stairs in or around the home?: No If so, are there any without handrails?: No Home free of loose throw rugs in walkways, pet beds, electrical cords, etc?: Yes Adequate lighting in your home to reduce risk of falls?: Yes Life alert?: No Use of a cane, walker or w/c?: No Grab bars in the bathroom?: No Shower chair or bench in shower?: No Elevated toilet seat or a handicapped toilet?: No  TIMED UP AND GO:  Was the test performed?  No  Cognitive Function: 6CIT completed        06/13/2023    9:45 AM  6CIT Screen  What Year? 0 points  What month? 0 points  What time? 0 points  Count back from 20 0 points  Months in reverse 0 points  Repeat phrase 0 points  Total Score 0 points    Immunizations Immunization History  Administered Date(s) Administered   Fluad Quad(high Dose 65+) 12/02/2020, 02/21/2022   Janssen (J&J) SARS-COV-2 Vaccination 08/16/2019   PNEUMOCOCCAL CONJUGATE-20 10/22/2020   Tdap 12/02/2020    Screening Tests Health Maintenance  Topic Date Due   Hepatitis C Screening  Never done   Zoster Vaccines- Shingrix  (1 of 2) Never done   COVID-19 Vaccine (2 - 2024-25 season) 11/13/2022   INFLUENZA VACCINE  10/13/2023   Lung Cancer Screening  05/18/2024   Medicare Annual Wellness (AWV)  06/12/2024   Fecal DNA (Cologuard)  08/10/2024   DTaP/Tdap/Td (2 - Td or Tdap) 12/03/2030   Pneumonia Vaccine 91+ Years old  Completed   HPV VACCINES  Aged Out    Health Maintenance  Health Maintenance Due  Topic Date Due   Hepatitis C Screening  Never done   Zoster Vaccines- Shingrix (1 of 2) Never done   COVID-19 Vaccine (2 - 2024-25 season) 11/13/2022   Health Maintenance Items Addressed: 06/13/2023 Referral sent to Optometry/Ophthalmology - [facility] Referral to [facility] - pt needs a routine dental exam  Additional Screening:  Vision Screening: Recommended annual ophthalmology exams for early detection of glaucoma and other disorders of the eye.  Dental Screening: Recommended annual dental exams for proper oral hygiene  Community Resource Referral / Chronic Care Management: CRR required this visit?  No   CCM required this visit?  No     Plan:     I have personally reviewed and noted the following in the patient's chart:   Medical and social history Use of alcohol, tobacco or illicit drugs  Current medications and supplements including opioid prescriptions. Patient is not currently taking opioid prescriptions. Functional ability and status Nutritional status Physical activity Advanced directives List of other physicians Hospitalizations, surgeries, and ER visits in previous 12 months Vitals Screenings to include cognitive, depression, and falls Referrals and appointments  In addition, I have reviewed and discussed with patient certain preventive protocols, quality metrics, and best practice  recommendations. A written personalized care plan for preventive services as well as general preventive health recommendations were provided to patient.     Darreld Mclean, CMA   06/13/2023    After Visit Summary: (In Person-Declined) Patient declined AVS at this time.  Notes: Please refer to Routing Comments.

## 2023-06-13 NOTE — Assessment & Plan Note (Signed)
 Stop smoking last January. Risk factor for several concerns.

## 2023-06-13 NOTE — Addendum Note (Signed)
 Addended by: Darreld Mclean on: 06/13/2023 10:06 AM   Modules accepted: Orders

## 2023-06-13 NOTE — Assessment & Plan Note (Signed)
 Contributing to chronic hand pain Meloxicam helps Has seen rheumatologist in the past and will follow-up with her again

## 2023-06-13 NOTE — Patient Instructions (Addendum)
 Mr. Colson , Thank you for taking time to come for your Medicare Wellness Visit. I appreciate your ongoing commitment to your health goals. Please review the following plan we discussed and let me know if I can assist you in the future.   Referrals/Orders/Follow-Ups/Clinician Recommendations: Aim for 30 minutes of exercise or brisk walking, 6-8 glasses of water, and 5 servings of fruits and vegetables each day.   This is a list of the screening recommended for you and due dates:  Health Maintenance  Topic Date Due   Hepatitis C Screening  Never done   Zoster (Shingles) Vaccine (1 of 2) Never done   COVID-19 Vaccine (2 - 2024-25 season) 11/13/2022   Flu Shot  10/13/2023   Screening for Lung Cancer  05/18/2024   Medicare Annual Wellness Visit  06/12/2024   Cologuard (Stool DNA test)  08/10/2024   DTaP/Tdap/Td vaccine (2 - Td or Tdap) 12/03/2030   Pneumonia Vaccine  Completed   HPV Vaccine  Aged Out    Advanced directives: (Provided) Advance directive discussed with you today. I have provided a copy for you to complete at home and have notarized. Once this is complete, please bring a copy in to our office so we can scan it into your chart.   Next Medicare Annual Wellness Visit scheduled for next year: Yes

## 2023-06-13 NOTE — Addendum Note (Signed)
 Addended by: Theressa Stamps on: 06/13/2023 10:13 AM   Modules accepted: Orders

## 2023-06-13 NOTE — Patient Instructions (Signed)
 GERD in Adults: Diet Changes When you have gastroesophageal reflux disease (GERD), you may need to make changes to your diet. Choosing the right foods can help with your symptoms. Think about working with an expert in healthy eating called a dietitian. They can help you make healthy food choices. What are tips for following this plan? Reading food labels Look for foods that are low in saturated fat. Foods that may help with your symptoms include: Foods with less than 5% of daily value (DV) of fat. Foods with 0 grams of trans fat. Cooking [employer] in ways that don't use a lot of fat. These ways include: Baking. Steaming. Grilling. Broiling. To add flavor, try to use herbs that are low in spice and acidity. Avoid frying your food. Meal planning  Eat small meals often rather than eating 3 large meals each day. Eat your meals slowly in a place where you feel relaxed. If told by your health care provider, avoid: Foods that cause symptoms. Keep a food diary to keep track of foods that cause symptoms. Alcohol. Drinking a lot of liquid with meals. General instructions For 2-3 hours after you eat, avoid: Bending over. Exercise. Lying down. Chew sugar-free gum after meals. What foods should I eat? Eat a healthy diet. Try to include: Foods with high amounts of fiber. These include: Fruits and vegetables. Whole grains and beans. Low-fat dairy products. Lean meats, fish, and poultry. Egg whites. Foods that cause symptoms in someone else may not cause symptoms for you. Work with your provider to find foods that are safe for you. The items listed above may not be all the foods and drinks you can have. Talk with a dietitian to learn more. The items listed above may not be a complete list of foods and beverages you can eat and drink. Contact a dietitian for more information. What foods should I avoid? Limiting some of these foods may help with your symptoms. Each person is different.  Talk with a dietitian or your provider to help you find the exact foods to avoid. Some of the foods to avoid may include: Fruits Fruits with a lot of acid in them. These may include citrus fruits, such as oranges, grapefruit, pineapple, and lemons. Vegetables Deep-fried vegetables, such as [place] fries. Vegetables, sauces, or toppings made with added fat and vegetables with acid in them. These may include tomatoes and tomato products, chili peppers, onions, garlic, and horseradish. Grains Pastries or quick breads with added fat. Meats and other proteins High-fat meats, such as fatty beef or pork, hot dogs, ribs, ham, sausage, salami, and bacon. Fried meat or protein, such as fried fish and fried chicken. Egg yolks. Fats and oils Butter. Margarine. Shortening. Ghee. Drinks Coffee and other drinks with caffeine in them. Fizzy and sugary drinks, such as soda and energy drinks. Fruit juice made with acidic fruits, such as orange or grapefruit. Tomato juice. Sweets and desserts Chocolate and cocoa. Donuts. Seasonings and condiments Mint, such as peppermint and spearmint. Condiments, herbs, or seasonings that cause symptoms. These may include curry, hot sauce, or vinegar-based salad dressings. The items listed above may not be all the foods and drinks you should avoid. Talk with a dietitian to learn more. Questions to ask your health care provider Changes to your diet and everyday life are often the first steps taken to manage symptoms of GERD. If these changes don't help, talk with your provider about taking medicines. Where to find more information International Foundation for Gastrointestinal Disorders:  aboutgerd.org This information is not intended to replace advice given to you by your health care provider. Make sure you discuss any questions you have with your health care provider. Document Revised: 01/10/2023 Document Reviewed: 07/27/2022 Elsevier Patient Education  2024 [employer].

## 2023-06-13 NOTE — Progress Notes (Signed)
 Tyler Zuniga 68 y.o.   Chief Complaint  Patient presents with   Arthritis    Patient states his right hand has been swelling, painful and ring finger has been locking up. 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 Patient also mentions  in the last 2 weeks he's been having issues with his stomach, he gets nauseas, gassy, and diarrhea, hasn't been able to eat like he used to     HISTORY OF PRESENT ILLNESS: Acute problem visit today.  Patient of Dr. Sanda Linger. This is a 68 y.o. male with 2 complaints today: 1.  Chronic pain to right hand with deformities.  History of arthritis.  Meloxicam helps. 2.  Upper abdominal discomfort with early satiety, nausea, constipation/diarrhea.  Stopped smoking last January. Taking Pepto-Bismol regularly with black stools. No other complaints or associated symptomatology.  Arthritis Associated symptoms include diarrhea. Pertinent negatives include no dysuria, fever or rash.     Prior to Admission medications   Medication Sig Start Date End Date Taking? Authorizing Provider  albuterol (VENTOLIN HFA) 108 (90 Base) MCG/ACT inhaler TAKE 2 PUFFS BY MOUTH EVERY 6 HOURS AS NEEDED FOR WHEEZE OR SHORTNESS OF BREATH 07/26/22  Yes Etta Grandchild, MD  finasteride (PROSCAR) 5 MG tablet Take 5 mg by mouth daily. 08/05/21  Yes [provider]  ibuprofen (ADVIL) 200 MG tablet Take 200 mg by mouth every 6 (six) hours as needed for moderate pain (pain score 4-6).   Yes [provider]  rosuvastatin (CRESTOR) 10 MG tablet Take 1 tablet (10 mg total) by mouth daily. 02/28/22  Yes Etta Grandchild, MD  umeclidinium-vilanterol [facility] ELLIPTA) 62.5-25 MCG/ACT AEPB Inhale 1 puff into the lungs daily at 6 (six) AM. 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 08/10/2021   Colon cancer screening 06/28/2021   Dupuytren's contracture of right hand 06/28/2021   Foraminal stenosis of cervical region 10/23/2020   Need for vaccination 10/23/2020   Mixed simple and mucopurulent chronic bronchitis (HCC) 10/22/2020   Tobacco abuse 10/22/2020   Erectile dysfunction due to arterial insufficiency 10/22/2020    Past Medical History:  Diagnosis Date   Acute lateral meniscus tear of right knee    Asthma    Bronchitis    Heart murmur     Past Surgical History:  Procedure Laterality Date   TUMOR REMOVAL      Social History   Socioeconomic History   Marital status: Media planner    Spouse name: Not on file   Number of children: Not on file   Years of education: Not on file   Highest education level: Not on file  Occupational History   Not on file  Tobacco Use   Smoking status: Every Day    Current packs/day: 2.00    Average packs/day: 2.0 packs/day  for 40.0 years (80.0 ttl pk-yrs)    Types: Cigarettes    Passive exposure: Past   Smokeless tobacco: Never  Vaping Use   Vaping status: Never Used  Substance and Sexual Activity   Alcohol use: Never   Drug use: Never   Sexual activity: Yes    Partners: Female  Other Topics Concern   Not on file  Social History Narrative   Not on file   Social Drivers of Health   Financial Resource Strain: Low Risk  (06/13/2023)   Overall Financial Resource Strain (CARDIA)    Difficulty of Paying Living Expenses: Not hard at all  Food Insecurity: No Food Insecurity  (06/13/2023)   Hunger Vital Sign    Worried About Running Out of Food in the Last Year: Never true    Ran Out of Food in the Last Year: Never true  Transportation Needs: No Transportation Needs (06/13/2023)   PRAPARE - Administrator, Civil Service (Medical): No    Lack of Transportation (Non-Medical): No  Physical Activity: Sufficiently Active (06/13/2023)   Exercise Vital Sign    Days of Exercise per Week: 4 days    Minutes of Exercise per Session: 60 min  Stress: No Stress Concern Present (06/13/2023)   [employer] of Occupational Health - Occupational Stress Questionnaire    Feeling of Stress : Not at all  Social Connections: Moderately Integrated (06/13/2023)   Social Connection and Isolation Panel [NHANES]    Frequency of Communication with Friends and Family: More than three times a week    Frequency of Social Gatherings with Friends and Family: More than three times a week    Attends Religious Services: More than 4 times per year    Active Member of [employer] or Organizations: No    Attends Banker Meetings: Never    Marital Status: Living with partner  Intimate Partner Violence: Not At Risk (06/13/2023)   Humiliation, Afraid, Rape, and Kick questionnaire    Fear of Current or Ex-Partner: No    Emotionally Abused: No    Physically Abused: No    Sexually Abused: No    Family History  Problem Relation Age of Onset   Stroke Mother    Arthritis Mother    COPD Father    Alcohol abuse Father    Heart disease Brother    Drug abuse Brother    COPD Brother    Cancer Maternal Grandmother    Arthritis Maternal Grandmother    Heart attack Maternal Grandfather      Review of Systems  Constitutional: Negative.  Negative for chills and fever.  HENT: Negative.  Negative for congestion and sore throat.   Respiratory: Negative.  Negative for cough and shortness of breath.   Cardiovascular: Negative.  Negative for chest pain and palpitations.  Gastrointestinal:   Positive for abdominal pain, constipation, diarrhea, heartburn and nausea.  Genitourinary: Negative.  Negative for dysuria and hematuria.  Musculoskeletal:  Positive for arthritis and joint pain.  Skin: Negative.  Negative for rash.  Neurological: Negative.  Negative for dizziness and headaches.  All other systems reviewed and are negative.   Vitals:   06/13/23 1018  BP: 130/88  Pulse: 68  Temp: 98.6 F (37 C)  SpO2: 96%    Physical Exam Vitals reviewed.  Constitutional:      Appearance: Normal appearance.  HENT:     Head: Normocephalic.     Mouth/Throat:     Mouth: Mucous membranes are moist.  Pharynx: Oropharynx is clear.  Eyes:     Extraocular Movements: Extraocular movements intact.  Cardiovascular:     Rate and Rhythm: Normal rate and regular rhythm.     Pulses: Normal pulses.     Heart sounds: Normal heart sounds.  Pulmonary:     Effort: Pulmonary effort is normal.     Breath sounds: Normal breath sounds.  Abdominal:     Palpations: Abdomen is soft.     Tenderness: There is no abdominal tenderness.  Musculoskeletal:        General: Deformity (MCP joints both hands several fingers) present.     Cervical back: No tenderness.  Lymphadenopathy:     Cervical: No cervical adenopathy.  Skin:    General: Skin is warm and dry.     Capillary Refill: Capillary refill takes less than 2 seconds.  Neurological:     General: No focal deficit present.     Mental Status: He is alert and oriented to person, place, and time.  Psychiatric:        Mood and Affect: Mood normal.        Behavior: Behavior normal.      ASSESSMENT & PLAN: A total of 44 minutes was spent with the patient and counseling/coordination of care regarding preparing for this visit, review of most recent office visit notes, review of multiple chronic medical conditions under management, review of all medications, differential diagnosis of dyspepsia and need for upper endoscopy, symptom management, pain  management, education on nutrition, prognosis, ED precautions, documentation and need for follow-up.  Problem List Items Addressed This Visit       Musculoskeletal and Integument   Foraminal stenosis of cervical region   Relevant Medications   meloxicam (MOBIC) 7.5 MG tablet   Primary osteoarthritis of both hands   Contributing to chronic hand pain Meloxicam helps Has seen rheumatologist in the past and will follow-up with her again      Relevant Medications   meloxicam (MOBIC) 7.5 MG tablet     Other   Right hand pain   Secondary to osteoarthritis.  Will follow-up with rheumatologist Symptom management discussed Pain management discussed      Dyspepsia - Primary   Red flag symptoms in a smoker Recommend GI evaluation and upper endoscopy In the meantime we will start pantoprazole 40 mg daily Diet and nutrition discussed Advised not to take meloxicam daily to avoid further gastric irritation      Relevant Medications   pantoprazole (PROTONIX) 40 MG tablet   Other Relevant Orders   Ambulatory referral to Gastroenterology   Lipase   Comprehensive metabolic panel with GFR   CBC with Differential/Platelet   Current smoker   Stop smoking last January. Risk factor for several concerns.      Relevant Orders   Ambulatory referral to Gastroenterology   Early satiety   Worrisome symptom.  Differential diagnosis discussed including possibility of malignancy.  Needs upper endoscopy.  Referral placed today.      Relevant Orders   Ambulatory referral to Gastroenterology   Other Visit Diagnoses       Need for hepatitis C screening test          Patient Instructions  GERD in Adults: Diet Changes When you have gastroesophageal reflux disease (GERD), you may need to make changes to your diet. Choosing the right foods can help with your symptoms. Think about working with an expert in healthy eating called a dietitian. They can help you make healthy food choices.  What are  tips for following this plan? Reading food labels Look for foods that are low in saturated fat. Foods that may help with your symptoms include: Foods with less than 5% of daily value (DV) of fat. Foods with 0 grams of trans fat. Cooking [employer] in ways that don't use a lot of fat. These ways include: Baking. Steaming. Grilling. Broiling. To add flavor, try to use herbs that are low in spice and acidity. Avoid frying your food. Meal planning  Eat small meals often rather than eating 3 large meals each day. Eat your meals slowly in a place where you feel relaxed. If told by your health care provider, avoid: Foods that cause symptoms. Keep a food diary to keep track of foods that cause symptoms. Alcohol. Drinking a lot of liquid with meals. General instructions For 2-3 hours after you eat, avoid: Bending over. Exercise. Lying down. Chew sugar-free gum after meals. What foods should I eat? Eat a healthy diet. Try to include: Foods with high amounts of fiber. These include: Fruits and vegetables. Whole grains and beans. Low-fat dairy products. Lean meats, fish, and poultry. Egg whites. Foods that cause symptoms in someone else may not cause symptoms for you. Work with your provider to find foods that are safe for you. The items listed above may not be all the foods and drinks you can have. Talk with a dietitian to learn more. The items listed above may not be a complete list of foods and beverages you can eat and drink. Contact a dietitian for more information. What foods should I avoid? Limiting some of these foods may help with your symptoms. Each person is different. Talk with a dietitian or your provider to help you find the exact foods to avoid. Some of the foods to avoid may include: Fruits Fruits with a lot of acid in them. These may include citrus fruits, such as oranges, grapefruit, pineapple, and lemons. Vegetables Deep-fried vegetables, such as [place]  fries. Vegetables, sauces, or toppings made with added fat and vegetables with acid in them. These may include tomatoes and tomato products, chili peppers, onions, garlic, and horseradish. Grains Pastries or quick breads with added fat. Meats and other proteins High-fat meats, such as fatty beef or pork, hot dogs, ribs, ham, sausage, salami, and bacon. Fried meat or protein, such as fried fish and fried chicken. Egg yolks. Fats and oils Butter. Margarine. Shortening. Ghee. Drinks Coffee and other drinks with caffeine in them. Fizzy and sugary drinks, such as soda and energy drinks. Fruit juice made with acidic fruits, such as orange or grapefruit. Tomato juice. Sweets and desserts Chocolate and cocoa. Donuts. Seasonings and condiments Mint, such as peppermint and spearmint. Condiments, herbs, or seasonings that cause symptoms. These may include curry, hot sauce, or vinegar-based salad dressings. The items listed above may not be all the foods and drinks you should avoid. Talk with a dietitian to learn more. Questions to ask your health care provider Changes to your diet and everyday life are often the first steps taken to manage symptoms of GERD. If these changes don't help, talk with your provider about taking medicines. Where to find more information International Foundation for Gastrointestinal Disorders: aboutgerd.org This information is not intended to replace advice given to you by your health care provider. Make sure you discuss any questions you have with your health care provider. Document Revised: 01/10/2023 Document Reviewed: 07/27/2022 Elsevier Patient Education  2024 Elsevier Inc.    Edwina Barth, MD  [facility]

## 2023-06-13 NOTE — Assessment & Plan Note (Addendum)
 Secondary to osteoarthritis.  Will follow-up with rheumatologist Symptom management discussed Pain management discussed

## 2023-06-13 NOTE — Assessment & Plan Note (Signed)
 Worrisome symptom.  Differential diagnosis discussed including possibility of malignancy.  Needs upper endoscopy.  Referral placed today.

## 2023-06-13 NOTE — Assessment & Plan Note (Signed)
 Red flag symptoms in a smoker Recommend GI evaluation and upper endoscopy In the meantime we will start pantoprazole 40 mg daily Diet and nutrition discussed Advised not to take meloxicam daily to avoid further gastric irritation

## 2023-06-14 LAB — HEPATITIS C ANTIBODY: Hepatitis C Ab: NONREACTIVE

## 2023-06-20 ENCOUNTER — Ambulatory Visit (INDEPENDENT_AMBULATORY_CARE_PROVIDER_SITE_OTHER): Admitting: Gastroenterology

## 2023-06-20 ENCOUNTER — Other Ambulatory Visit: Payer: Self-pay | Admitting: *Deleted

## 2023-06-20 ENCOUNTER — Encounter: Payer: Self-pay | Admitting: Gastroenterology

## 2023-06-20 VITALS — BP 102/70 | HR 62 | Ht 70.0 in | Wt 204.0 lb

## 2023-06-20 DIAGNOSIS — Z87891 Personal history of nicotine dependence: Secondary | ICD-10-CM

## 2023-06-20 DIAGNOSIS — Z122 Encounter for screening for malignant neoplasm of respiratory organs: Secondary | ICD-10-CM

## 2023-06-20 DIAGNOSIS — R1013 Epigastric pain: Secondary | ICD-10-CM

## 2023-06-20 DIAGNOSIS — R12 Heartburn: Secondary | ICD-10-CM

## 2023-06-20 DIAGNOSIS — Z1211 Encounter for screening for malignant neoplasm of colon: Secondary | ICD-10-CM

## 2023-06-20 DIAGNOSIS — F17210 Nicotine dependence, cigarettes, uncomplicated: Secondary | ICD-10-CM

## 2023-06-20 MED ORDER — NA SULFATE-K SULFATE-MG SULF 17.5-3.13-1.6 GM/177ML PO SOLN: 1.0000 | ORAL | 0 refills | Status: DC

## 2023-06-20 NOTE — Patient Instructions (Signed)
 Your provider has ordered "[employer]" stool testing for you. You have received a kit from our office today containing all necessary supplies to complete this test. Please carefully read the stool collection instructions provided in the kit before opening the accompanying materials. In addition, be sure there is a label providing your full name and date of birth on the "puritan opti-swab" tube that is supplied in the kit (if you do not see a label with this information on your test tube, please make [place] aware before test collection!). After completing the test, you should secure the purtian tube into the specimen biohazard bag. The [facility] E-Req sheet (including date and time of specimen collection) should be placed into the outside pocket of the specimen biohazard bag and returned to the [place] lab (basement floor of [facility]) within 3 days of collection. Please make sure to give the specimen to a staff member at the lab. DO NOT leave the specimen on the counter.   If the specimen date and time (can be found in the upper right boxed portion of the sheet) are not filled out on the E-Req sheet, the test will NOT be performed.   We have sent the following medications to your pharmacy for you to pick up at your convenience: Suprep   Send my chart message in 2-3 weeks with update of symptoms.   Thank you for choosing me and [facility].  Dr. Meridee Score

## 2023-06-20 NOTE — Progress Notes (Unsigned)
 GASTROENTEROLOGY OUTPATIENT CLINIC VISIT   Primary Care Provider Etta Grandchild, MD [address] [place] [address] [phone]  Referring Provider Etta Grandchild, MD [address] [place],  [place] [address] [phone]  Patient Profile: Tyler Zuniga is a 68 y.o. male with a pmh significant for BPH, arthritis, hyperlipidemia, asthma.  The patient presents to the [facility] for an evaluation and management of problem(s) noted below:  Problem List 1. Colon cancer screening   2. Dyspepsia   3. Pyrosis   4. Abdominal pain, epigastric     Discussed the use of AI scribe software for clinical note transcription with the patient, who gave verbal consent to proceed.  History of Present Illness This is the patient's first visit to the [facility] GI clinic.  He presents for urgent evaluation by PCP office for recent issues of upper abdominal discomfort and nausea and anorexia and weight loss and pyrosis.  Today, he is accompanied by his wife.  The patient states that after a trip to [facility], he began to experience upper abdominal discomfort and nausea and anorexia and increased belching/eructation, bloating and hiccups.  Initial relief was achieved with Pepto Bismol, and complete resolution occurred with pantoprazole initiated his PCP office.  Today, he states that he is completely better without lingering symptoms.  He is hopeful to come off of his PPI in the next few weeks.  He is taking Meloxicam (a new medication currently) but this was not an issue a few years ago when he used it regularly.  Occasionally a few times a week he would take Ibuprofen for arthritis issues, but not on a daily basis.  He reports a rare episode of darker stool that could occur, but more recently had used Pepto Bismol which he attributed to that.  He also reports rare episodes of rectal bleeding 4 times a year that he has attributed to constipation and straining.   He states he  had a colonoscopy >12 years ago and was told it was normal.  He is ready for continuing colon cancer screening and would like to pursue colonoscopy.  He also quit smoking in January and uses nicotine lozenges. He has a history of inguinal hernia surgery.  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   GI Review of Systems Positive as above Negative for hematochezia  Review of Systems General: Denies fevers/chills Cardiovascular: Denies chest pain Pulmonary: Denies shortness of breath Gastroenterological: See HPI Genitourinary: Denies darkened urine Hematological: Denies easy bruising/bleeding Endocrine: Denies temperature intolerance Dermatological: Denies jaundice Psychological: Mood is stable   Medications Current Outpatient Medications  Medication Sig Dispense Refill   albuterol (VENTOLIN HFA) 108 (90 Base) MCG/ACT inhaler TAKE 2 PUFFS BY MOUTH EVERY 6 HOURS AS NEEDED FOR WHEEZE OR SHORTNESS OF BREATH 18 each 3   finasteride (PROSCAR) 5 MG tablet Take 5 mg by mouth daily.     ibuprofen (ADVIL) 200 MG tablet Take 200 mg by mouth every 6 (six) hours as needed for moderate pain (pain score 4-6).     meloxicam (MOBIC) 7.5 MG tablet Take 1 tablet (7.5 mg total) by mouth daily. 30 tablet 0   Na Sulfate-K Sulfate-Mg Sulfate concentrate (SUPREP BOWEL PREP KIT) 17.5-3.13-1.6 GM/177ML SOLN Take 1 kit (354 mLs total) by mouth as directed. For colonoscopy prep 354 mL 0   pantoprazole (PROTONIX) 40 MG tablet Take 1 tablet (40  mg total) by mouth daily. 30 tablet 3   rosuvastatin (CRESTOR) 10 MG tablet Take 1 tablet (10 mg total) by mouth daily. 90 tablet 1   umeclidinium-vilanterol (ANORO ELLIPTA) 62.5-25 MCG/ACT AEPB Inhale 1 puff into the lungs daily at 6 (six) AM. 120 each 1   No current facility-administered medications for this visit.    Allergies Allergies  Allergen Reactions   Penicillin G Itching     Histories Past Medical History:  Diagnosis Date   Acute lateral meniscus tear of right knee    Asthma    Bronchitis    Heart murmur    Past Surgical History:  Procedure Laterality Date   TUMOR REMOVAL     Social History   Socioeconomic History   Marital status: Media planner    Spouse name: Not on file   Number of children: Not on file   Years of education: Not on file   Highest education level: Not on file  Occupational History   Not on file  Tobacco Use   Smoking status: Former    Current packs/day: 2.00    Average packs/day: 2.0 packs/day for 40.0 years (80.0 ttl pk-yrs)    Types: Cigarettes    Passive exposure: Past   Smokeless tobacco: Never   Tobacco comments:    Stopped smoking in January  Vaping Use   Vaping status: Never Used  Substance and Sexual Activity   Alcohol use: Never   Drug use: Never   Sexual activity: Yes    Partners: Female  Other Topics Concern   Not on file  Social History Narrative   Not on file   Social Drivers of Health   Financial Resource Strain: Low Risk  (06/13/2023)   Overall Financial Resource Strain (CARDIA)    Difficulty of Paying Living Expenses: Not hard at all  Food Insecurity: No Food Insecurity (06/13/2023)   Hunger Vital Sign    Worried About Running Out of Food in the Last Year: Never true    Ran Out of Food in the Last Year: Never true  Transportation Needs: No Transportation Needs (06/13/2023)   PRAPARE - Administrator, Civil Service (Medical): No    Lack of Transportation (Non-Medical): No  Physical Activity: Sufficiently Active (06/13/2023)   Exercise Vital Sign    Days of Exercise per Week: 4 days    Minutes of Exercise per Session: 60 min  Stress: No Stress Concern Present (06/13/2023)   [employer] of Occupational Health - Occupational Stress Questionnaire    Feeling of Stress : Not at all  Social Connections: Moderately Integrated (06/13/2023)   Social Connection and Isolation Panel  [NHANES]    Frequency of Communication with Friends and Family: More than three times a week    Frequency of Social Gatherings with Friends and Family: More than three times a week    Attends Religious Services: More than 4 times per year    Active Member of [employer] or Organizations: No    Attends Banker Meetings: Never    Marital Status: Living with partner  Intimate Partner Violence: Not At Risk (06/13/2023)   Humiliation, Afraid, Rape, and Kick questionnaire    Fear of Current or Ex-Partner: No    Emotionally Abused: No    Physically Abused: No    Sexually Abused: No   Family History  Problem Relation Age of Onset   Stroke Mother    Arthritis Mother    COPD Father    Alcohol abuse  Father    Heart disease Brother    Drug abuse Brother    COPD Brother    Cancer Maternal Grandmother    Arthritis Maternal Grandmother    Heart attack Maternal Grandfather    Colon cancer Neg Hx    Esophageal cancer Neg Hx    Inflammatory bowel disease Neg Hx    Liver disease Neg Hx    Pancreatic cancer Neg Hx    Rectal cancer Neg Hx    Stomach cancer Neg Hx    I have reviewed his medical, social, and family history in detail and updated the electronic medical record as necessary.    PHYSICAL EXAMINATION  BP 102/70   Pulse 62   Ht 5\' 10"  (1.778 m)   Wt 204 lb (92.5 kg)   SpO2 98%   BMI 29.27 kg/m  Wt Readings from Last 3 Encounters:  06/20/23 204 lb (92.5 kg)  06/13/23 201 lb (91.2 kg)  06/13/23 201 lb 9.6 oz (91.4 kg)  GEN: NAD, appears stated age, doesn't appear chronically ill, accompanied by wife PSYCH: Cooperative, without pressured speech EYE: Conjunctivae pink, sclerae anicteric ENT: MMM CV: Nontachycardic RESP: No audible wheezing GI: NABS, soft, NT/ND, without rebound or guarding, no HSM appreciated GU: DRE deferred as we are going to be pursuing colonoscopy in the coming weeks MSK/EXT: No significant lower extremity edema SKIN: No jaundice NEURO:  Alert &  Oriented x 3, no focal deficits   REVIEW OF DATA  I reviewed the following data at the time of this encounter:  GI Procedures and Studies  Patient reports colonoscopy greater than 12 years ago that was normal in outside facility told to follow-up in 10 years  Laboratory Studies  No relevant studies to review  Imaging Studies  Reviewed those in epic   ASSESSMENT  Tyler Zuniga is a 68 y.o. male with a pmh significant for BPH, arthritis, hyperlipidemia, asthma.  The patient is seen today for evaluation and management of:  1. Colon cancer screening   2. Dyspepsia   3. Pyrosis   4. Abdominal pain, epigastric    The patient is hemodynamically and clinically stable at this time.  The issues that he was dealing with after recent PCP evaluation have all seem to improve significantly after PPI initiation.  I still suspect that this was some sort of infectious etiology though underlying gastritis that is being adequately treated may also be playing a role 2.  Patient would like to try to come off of medications.  I told him he is at risk with his NSAID use of developing gastritis in future that could exacerbate symptoms, so he is aware of this but would still like to come off PPI if possible.  Will get H. pylori Diatherix testing.  Will get the patient up-to-date for colon cancer screening.  If as he down titrate his PPI, he has recurrent or progressive abdominal symptoms in the upper abdomen, then upper endoscopy will need to be pursued (we will go ahead and schedule that together in case needed and cancel if patient tells [place] in a few weeks that he is doing well).  Additional imaging may be considered pending how the patient does in the coming weeks.  The risks and benefits of endoscopic evaluation were discussed with the patient; these include but are not limited to the risk of perforation, infection, bleeding, missed lesions, lack of diagnosis, severe illness requiring hospitalization, as well as  anesthesia and sedation related illnesses.  The patient and/or  family is agreeable to proceed.  All patient questions were answered to the best of my ability, and the patient agrees to the aforementioned plan of action with follow-up as indicated.   PLAN  H. pylori Diatherix testing Proceed with scheduling colonoscopy for screening purposes Continue PPI for 2 weeks then may stop medication if patient continues to feel well Will schedule an upper endoscopy at same time as colonoscopy, but if patient is doing well and has come off PPI without any recurrent symptoms, we will cancel upper endoscopy If recurrent symptoms will need to consider abdominal ultrasound and CT imaging if negative upper endoscopy in future   Orders Placed This Encounter  Procedures   Other/Misc lab test   Ambulatory referral to Gastroenterology    New Prescriptions   NA SULFATE-K SULFATE-MG SULFATE CONCENTRATE (SUPREP BOWEL PREP KIT) 17.5-3.13-1.6 GM/177ML SOLN    Take 1 kit (354 mLs total) by mouth as directed. For colonoscopy prep   Modified Medications   No medications on file    Planned Follow Up No follow-ups on file.   Total Time in Face-to-Face and in Coordination of Care for patient including independent/personal interpretation/review of prior testing, medical history, examination, medication adjustment, communicating results with the patient directly, and documentation within the EHR is 45 minutes.   Corliss Parish, MD [facility]oscopy Office # [phone]

## 2023-06-21 ENCOUNTER — Encounter: Payer: Self-pay | Admitting: Gastroenterology

## 2023-07-10 ENCOUNTER — Other Ambulatory Visit: Payer: Self-pay | Admitting: Emergency Medicine

## 2023-07-10 DIAGNOSIS — M4802 Spinal stenosis, cervical region: Secondary | ICD-10-CM

## 2023-07-11 ENCOUNTER — Encounter: Payer: Self-pay | Admitting: Internal Medicine

## 2023-08-15 ENCOUNTER — Encounter: Admitting: Gastroenterology

## 2023-08-21 SURGERY — COLONOSCOPY
Anesthesia: Monitor Anesthesia Care

## 2023-09-08 ENCOUNTER — Other Ambulatory Visit: Payer: Self-pay | Admitting: Emergency Medicine

## 2023-09-08 DIAGNOSIS — R1013 Epigastric pain: Secondary | ICD-10-CM

## 2023-09-13 DIAGNOSIS — L237 Allergic contact dermatitis due to plants, except food: Secondary | ICD-10-CM | POA: Diagnosis not present

## 2023-09-27 ENCOUNTER — Encounter: Payer: Self-pay | Admitting: Vascular Surgery

## 2023-10-23 ENCOUNTER — Other Ambulatory Visit: Payer: Self-pay

## 2023-10-23 ENCOUNTER — Telehealth: Payer: Self-pay | Admitting: Gastroenterology

## 2023-10-23 MED ORDER — NA SULFATE-K SULFATE-MG SULF 17.5-3.13-1.6 GM/177ML PO SOLN
1.0000 | ORAL | 0 refills | Status: DC
Start: 2023-10-23 — End: 2023-10-25

## 2023-10-23 NOTE — Telephone Encounter (Signed)
 Inbound call from patient requesting for prep medication sent to pharmacy for 8/13 procedures. Please advise, thank you

## 2023-10-23 NOTE — Telephone Encounter (Signed)
 Refill sent.

## 2023-10-23 NOTE — Telephone Encounter (Signed)
 Suprep resent to pharmacy - [facility] [address]

## 2023-10-25 ENCOUNTER — Encounter: Payer: Self-pay | Admitting: Gastroenterology

## 2023-10-25 ENCOUNTER — Other Ambulatory Visit (INDEPENDENT_AMBULATORY_CARE_PROVIDER_SITE_OTHER)

## 2023-10-25 ENCOUNTER — Ambulatory Visit: Admitting: Gastroenterology

## 2023-10-25 ENCOUNTER — Ambulatory Visit: Payer: Self-pay | Admitting: Gastroenterology

## 2023-10-25 VITALS — BP 120/72 | HR 64 | Temp 97.7°F | Resp 14 | Ht 69.0 in | Wt 209.0 lb

## 2023-10-25 DIAGNOSIS — K2950 Unspecified chronic gastritis without bleeding: Secondary | ICD-10-CM

## 2023-10-25 DIAGNOSIS — K3189 Other diseases of stomach and duodenum: Secondary | ICD-10-CM

## 2023-10-25 DIAGNOSIS — J45909 Unspecified asthma, uncomplicated: Secondary | ICD-10-CM | POA: Diagnosis not present

## 2023-10-25 DIAGNOSIS — R1013 Epigastric pain: Secondary | ICD-10-CM

## 2023-10-25 DIAGNOSIS — R12 Heartburn: Secondary | ICD-10-CM

## 2023-10-25 DIAGNOSIS — K641 Second degree hemorrhoids: Secondary | ICD-10-CM

## 2023-10-25 DIAGNOSIS — R17 Unspecified jaundice: Secondary | ICD-10-CM

## 2023-10-25 DIAGNOSIS — K263 Acute duodenal ulcer without hemorrhage or perforation: Secondary | ICD-10-CM | POA: Diagnosis not present

## 2023-10-25 DIAGNOSIS — K449 Diaphragmatic hernia without obstruction or gangrene: Secondary | ICD-10-CM | POA: Diagnosis not present

## 2023-10-25 DIAGNOSIS — K31A0 Gastric intestinal metaplasia, unspecified: Secondary | ICD-10-CM

## 2023-10-25 DIAGNOSIS — K2980 Duodenitis without bleeding: Secondary | ICD-10-CM | POA: Diagnosis not present

## 2023-10-25 DIAGNOSIS — K269 Duodenal ulcer, unspecified as acute or chronic, without hemorrhage or perforation: Secondary | ICD-10-CM | POA: Diagnosis not present

## 2023-10-25 DIAGNOSIS — Z1211 Encounter for screening for malignant neoplasm of colon: Secondary | ICD-10-CM | POA: Diagnosis not present

## 2023-10-25 DIAGNOSIS — K229 Disease of esophagus, unspecified: Secondary | ICD-10-CM

## 2023-10-25 DIAGNOSIS — K2970 Gastritis, unspecified, without bleeding: Secondary | ICD-10-CM | POA: Diagnosis not present

## 2023-10-25 DIAGNOSIS — K259 Gastric ulcer, unspecified as acute or chronic, without hemorrhage or perforation: Secondary | ICD-10-CM | POA: Diagnosis not present

## 2023-10-25 DIAGNOSIS — D122 Benign neoplasm of ascending colon: Secondary | ICD-10-CM | POA: Diagnosis not present

## 2023-10-25 LAB — HEPATIC FUNCTION PANEL
ALT: 17 U/L (ref 0–53)
AST: 18 U/L (ref 0–37)
Albumin: 4.5 g/dL (ref 3.5–5.2)
Alkaline Phosphatase: 82 U/L (ref 39–117)
Bilirubin, Direct: 0.1 mg/dL (ref 0.0–0.3)
Total Bilirubin: 0.6 mg/dL (ref 0.2–1.2)
Total Protein: 7.8 g/dL (ref 6.0–8.3)

## 2023-10-25 MED ORDER — SUCRALFATE 1 GM/10ML PO SUSP: 1.0000 g | Freq: Two times a day (BID) | ORAL | 0 refills | Status: DC

## 2023-10-25 MED ORDER — SODIUM CHLORIDE 0.9 % IV SOLN: 500.0000 mL | Freq: Once | INTRAVENOUS | Status: DC

## 2023-10-25 MED ORDER — PANTOPRAZOLE SODIUM 40 MG PO TBEC: 40.0000 mg | DELAYED_RELEASE_TABLET | Freq: Two times a day (BID) | ORAL | 6 refills | Status: DC

## 2023-10-25 NOTE — Progress Notes (Signed)
 A/o x 3, VSS, gd SR's, pleased with anesthesia, report to RN

## 2023-10-25 NOTE — Patient Instructions (Addendum)
 Educational handout provided to patient related to Hemorrhoids, Polyps, and Diverticulosis, and Gastritis  Resume previous diet  Continue present medications  Awaiting pathology results  YOU HAD AN ENDOSCOPIC PROCEDURE TODAY AT THE [facility]:   Refer to the procedure report that was given to you for any specific questions about what was found during the examination.  If the procedure report does not answer your questions, please call your gastroenterologist to clarify.  If you requested that your care partner not be given the details of your procedure findings, then the procedure report has been included in a sealed envelope for you to review at your convenience later.  YOU SHOULD EXPECT: Some feelings of bloating in the abdomen. Passage of more gas than usual.  Walking can help get rid of the air that was put into your GI tract during the procedure and reduce the bloating. If you had a lower endoscopy (such as a colonoscopy or flexible sigmoidoscopy) you may notice spotting of blood in your stool or on the toilet paper. If you underwent a bowel prep for your procedure, you may not have a normal bowel movement for a few days.  Please Note:  You might notice some irritation and congestion in your nose or some drainage.  This is from the oxygen used during your procedure.  There is no need for concern and it should clear up in a day or so.  SYMPTOMS TO REPORT IMMEDIATELY:  Following lower endoscopy (colonoscopy or flexible sigmoidoscopy):  Excessive amounts of blood in the stool  Significant tenderness or worsening of abdominal pains  Swelling of the abdomen that is new, acute  Fever of 100F or higher  Following upper endoscopy (EGD)  Vomiting of blood or coffee ground material  New chest pain or pain under the shoulder blades  Painful or persistently difficult swallowing  New shortness of breath  Fever of 100F or higher  Black, tarry-looking stools  For urgent or  emergent issues, a gastroenterologist can be reached at any hour by calling [phone]. Do not use MyChart messaging for urgent concerns.    DIET:  We do recommend a small meal at first, but then you may proceed to your regular diet.  Drink plenty of fluids but you should avoid alcoholic beverages for 24 hours.  ACTIVITY:  You should plan to take it easy for the rest of today and you should NOT DRIVE or use heavy machinery until tomorrow (because of the sedation medicines used during the test).    FOLLOW UP: Our staff will call the number listed on your records the next business day following your procedure.  We will call around 7:15- 8:00 am to check on you and address any questions or concerns that you may have regarding the information given to you following your procedure. If we do not reach you, we will leave a message.     If any biopsies were taken you will be contacted by phone or by letter within the next 1-3 weeks.  Please call us  at [phone] if you have not heard about the biopsies in 3 weeks.    SIGNATURES/CONFIDENTIALITY: You and/or your care partner have signed paperwork which will be entered into your electronic medical record.  These signatures attest to the fact that that the information above on your After Visit Summary has been reviewed and is understood.  Full responsibility of the confidentiality of this discharge information lies with you and/or your care-partner.

## 2023-10-25 NOTE — Op Note (Signed)
 [facility] Patient Name: Tyler Zuniga Procedure Date: 10/25/2023 11:38 AM MRN: [id number] Endoscopist: Aloha Finner , MD, [id number] Age: 68 Referring MD:  Date of Birth: 11-19-1955 Gender: Male Account #: [id number] Procedure:                Upper GI endoscopy Indications:              Dyspepsia, Indigestion, Abdominal bloating Medicines:                Monitored Anesthesia Care Procedure:                Pre-Anesthesia Assessment:                           - Prior to the procedure, a History and Physical                            was performed, and patient medications and                            allergies were reviewed. The patient's tolerance of                            previous anesthesia was also reviewed. The risks                            and benefits of the procedure and the sedation                            options and risks were discussed with the patient.                            All questions were answered, and informed consent                            was obtained. Prior Anticoagulants: The patient has                            taken no anticoagulant or antiplatelet agents                            except for NSAID medication. ASA Grade Assessment:                            II - A patient with mild systemic disease. After                            reviewing the risks and benefits, the patient was                            deemed in satisfactory condition to undergo the                            procedure.  After obtaining informed consent, the endoscope was                            passed under direct vision. Throughout the                            procedure, the patient's blood pressure, pulse, and                            oxygen saturations were monitored continuously. The                            GIF [id number] was introduced through the                            mouth, and advanced to the  second part of duodenum.                            The upper GI endoscopy was accomplished without                            difficulty. The patient tolerated the procedure. Scope In: Scope Out: Findings:                 No gross lesions were noted in the entire esophagus.                           The Z-line was irregular and was found 41 cm from                            the incisors.                           Moderate inflammation characterized by erosions,                            erythema, friability was found at the incisura, in                            the gastric antrum and in the prepyloric region of                            the stomach. An antral ulcer 15 mm in diameter was                            noted.                           No other gross lesions were noted in the entire                            examined stomach. Biopsies were taken with a cold  forceps for histology and Helicobacter pylori                            testing.                           Patchy mild inflammation characterized by erosions,                            friability and granularity was found in the                            duodenal bulb, in the first portion of the duodenum                            and in the second portion of the duodenum. Biopsies                            were taken with a cold forceps for histology. Complications:            No immediate complications. Estimated Blood Loss:     Estimated blood loss was minimal. Impression:               - No gross lesions in the entire esophagus. Z-line                            irregular, 41 cm from the incisors.                           - Distal gastritis. Antral ulcer noted. No other                            gross lesions in the entire stomach. Biopsied.                           - Duodenitis. Biopsied. Recommendation:           - Proceed to scheduled colonoscopy.                           -  Initiate Protonix  40 mg twice daily.                           - Carafate  1 g twice daily for 1 month.                           - Minimize NSAIDs as able.                           - Await pathology results.                           - Repeat upper endoscopy in 3 months to check                            healing.                           -  The findings and recommendations were discussed                            with the patient.                           - The findings and recommendations were discussed                            with the patient's family. Aloha Finner, MD 10/25/2023 12:08:15 PM

## 2023-10-25 NOTE — Progress Notes (Signed)
 GASTROENTEROLOGY PROCEDURE H&P NOTE   Primary Care Physician: Joshua Debby CROME, MD  HPI: Tyler Zuniga is a 68 y.o. male who presents for EGD/Colonoscopy in setting of abdominal pain/dyspepsia and colon cancer screening.  Past Medical History:  Diagnosis Date   Acute lateral meniscus tear of right knee    Asthma    Bronchitis    Heart murmur    Past Surgical History:  Procedure Laterality Date   TUMOR REMOVAL     Current Outpatient Medications  Medication Sig Dispense Refill   albuterol  (VENTOLIN  HFA) 108 (90 Base) MCG/ACT inhaler TAKE 2 PUFFS BY MOUTH EVERY 6 HOURS AS NEEDED FOR WHEEZE OR SHORTNESS OF BREATH 18 each 3   finasteride (PROSCAR) 5 MG tablet Take 5 mg by mouth daily.     ibuprofen (ADVIL) 200 MG tablet Take 200 mg by mouth every 6 (six) hours as needed for moderate pain (pain score 4-6).     meloxicam  (MOBIC ) 7.5 MG tablet TAKE 1 TABLET BY MOUTH EVERY DAY 30 tablet 0   Na Sulfate-K Sulfate-Mg Sulfate concentrate (SUPREP BOWEL PREP KIT) 17.5-3.13-1.6 GM/177ML SOLN Take 1 kit (354 mLs total) by mouth as directed. For colonoscopy prep 354 mL 0   pantoprazole  (PROTONIX ) 40 MG tablet TAKE 1 TABLET BY MOUTH EVERY DAY 90 tablet 1   rosuvastatin  (CRESTOR ) 10 MG tablet Take 1 tablet (10 mg total) by mouth daily. 90 tablet 1   umeclidinium-vilanterol (ANORO ELLIPTA ) 62.5-25 MCG/ACT AEPB Inhale 1 puff into the lungs daily at 6 (six) AM. 120 each 1   Current Facility-Administered Medications  Medication Dose Route Frequency Provider Last Rate Last Admin   0.9 %  sodium chloride  infusion  500 mL Intravenous Once Mansouraty, Cosme Jacob Jr., MD        Current Outpatient Medications:    albuterol  (VENTOLIN  HFA) 108 (90 Base) MCG/ACT inhaler, TAKE 2 PUFFS BY MOUTH EVERY 6 HOURS AS NEEDED FOR WHEEZE OR SHORTNESS OF BREATH, Disp: 18 each, Rfl: 3   finasteride (PROSCAR) 5 MG tablet, Take 5 mg by mouth daily., Disp: , Rfl:    ibuprofen (ADVIL) 200 MG tablet, Take 200 mg by mouth  every 6 (six) hours as needed for moderate pain (pain score 4-6)., Disp: , Rfl:    meloxicam  (MOBIC ) 7.5 MG tablet, TAKE 1 TABLET BY MOUTH EVERY DAY, Disp: 30 tablet, Rfl: 0   Na Sulfate-K Sulfate-Mg Sulfate concentrate (SUPREP BOWEL PREP KIT) 17.5-3.13-1.6 GM/177ML SOLN, Take 1 kit (354 mLs total) by mouth as directed. For colonoscopy prep, Disp: 354 mL, Rfl: 0   pantoprazole  (PROTONIX ) 40 MG tablet, TAKE 1 TABLET BY MOUTH EVERY DAY, Disp: 90 tablet, Rfl: 1   rosuvastatin  (CRESTOR ) 10 MG tablet, Take 1 tablet (10 mg total) by mouth daily., Disp: 90 tablet, Rfl: 1   umeclidinium-vilanterol (ANORO ELLIPTA ) 62.5-25 MCG/ACT AEPB, Inhale 1 puff into the lungs daily at 6 (six) AM., Disp: 120 each, Rfl: 1  Current Facility-Administered Medications:    0.9 %  sodium chloride  infusion, 500 mL, Intravenous, Once, Mansouraty, Aloha Raddle., MD Allergies  Allergen Reactions   Penicillin G Itching   Family History  Problem Relation Age of Onset   Stroke Mother    Arthritis Mother    COPD Father    Alcohol abuse Father    Heart disease Brother    Drug abuse Brother    COPD Brother    Cancer Maternal Grandmother    Arthritis Maternal Grandmother    Heart attack Maternal Grandfather  Colon cancer Neg Hx    Esophageal cancer Neg Hx    Inflammatory bowel disease Neg Hx    Liver disease Neg Hx    Pancreatic cancer Neg Hx    Rectal cancer Neg Hx    Stomach cancer Neg Hx    Social History   Socioeconomic History   Marital status: Media planner    Spouse name: Not on file   Number of children: Not on file   Years of education: Not on file   Highest education level: Not on file  Occupational History   Not on file  Tobacco Use   Smoking status: Former    Current packs/day: 2.00    Average packs/day: 2.0 packs/day for 40.0 years (80.0 ttl pk-yrs)    Types: Cigarettes    Passive exposure: Past   Smokeless tobacco: Never   Tobacco comments:    Stopped smoking in January  Vaping Use    Vaping status: Never Used  Substance and Sexual Activity   Alcohol use: Never   Drug use: Never   Sexual activity: Yes    Partners: Female  Other Topics Concern   Not on file  Social History Narrative   Not on file   Social Drivers of Health   Financial Resource Strain: Low Risk  (06/13/2023)   Overall Financial Resource Strain (CARDIA)    Difficulty of Paying Living Expenses: Not hard at all  Food Insecurity: No Food Insecurity (06/13/2023)   Hunger Vital Sign    Worried About Running Out of Food in the Last Year: Never true    Ran Out of Food in the Last Year: Never true  Transportation Needs: No Transportation Needs (06/13/2023)   PRAPARE - Administrator, Civil Service (Medical): No    Lack of Transportation (Non-Medical): No  Physical Activity: Sufficiently Active (06/13/2023)   Exercise Vital Sign    Days of Exercise per Week: 4 days    Minutes of Exercise per Session: 60 min  Stress: No Stress Concern Present (06/13/2023)   [employer] of Occupational Health - Occupational Stress Questionnaire    Feeling of Stress : Not at all  Social Connections: Moderately Integrated (06/13/2023)   Social Connection and Isolation Panel    Frequency of Communication with Friends and Family: More than three times a week    Frequency of Social Gatherings with Friends and Family: More than three times a week    Attends Religious Services: More than 4 times per year    Active Member of [employer] or Organizations: No    Attends Banker Meetings: Never    Marital Status: Living with partner  Intimate Partner Violence: Not At Risk (06/13/2023)   Humiliation, Afraid, Rape, and Kick questionnaire    Fear of Current or Ex-Partner: No    Emotionally Abused: No    Physically Abused: No    Sexually Abused: No    Physical Exam: Today's Vitals   10/25/23 1039  BP: (!) 143/86  Pulse: 68  Temp: 97.7 F (36.5 C)  TempSrc: Skin  SpO2: 95%  Weight: 209 lb (94.8 kg)  Height: 5'  9 (1.753 m)   Body mass index is 30.86 kg/m. GEN: NAD EYE: Sclerae anicteric ENT: MMM CV: Non-tachycardic GI: Soft, NT/ND NEURO:  Alert & Oriented x 3  Lab Results: No results for input(s): WBC, HGB, HCT, PLT in the last 72 hours. BMET No results for input(s): NA, K, CL, CO2, GLUCOSE, BUN, CREATININE, CALCIUM  in the last  72 hours. LFT No results for input(s): PROT, ALBUMIN, AST, ALT, ALKPHOS, BILITOT, BILIDIR, IBILI in the last 72 hours. PT/INR No results for input(s): LABPROT, INR in the last 72 hours.   Impression / Plan: This is a 68 y.o.male who presents for EGD/Colonoscopy in setting of abdominal pain/dyspepsia and colon cancer screening.  The risks and benefits of endoscopic evaluation/treatment were discussed with the patient and/or family; these include but are not limited to the risk of perforation, infection, bleeding, missed lesions, lack of diagnosis, severe illness requiring hospitalization, as well as anesthesia and sedation related illnesses.  The patient's history has been reviewed, patient examined, no change in status, and deemed stable for procedure.  The patient and/or family is agreeable to proceed.    Aloha Finner, MD [facility] Office # [phone]

## 2023-10-25 NOTE — Op Note (Signed)
 [facility] Patient Name: Tyler Zuniga Procedure Date: 10/25/2023 11:36 AM MRN: [id number] Endoscopist: Aloha Finner , MD, [id number] Age: 68 Referring MD:  Date of Birth: 12-19-1955 Gender: Male Account #: [id number] Procedure:                Colonoscopy Indications:              Screening for colorectal malignant neoplasm Medicines:                Monitored Anesthesia Care Procedure:                Pre-Anesthesia Assessment:                           - Prior to the procedure, a History and Physical                            was performed, and patient medications and                            allergies were reviewed. The patient's tolerance of                            previous anesthesia was also reviewed. The risks                            and benefits of the procedure and the sedation                            options and risks were discussed with the patient.                            All questions were answered, and informed consent                            was obtained. Prior Anticoagulants: The patient has                            taken no anticoagulant or antiplatelet agents                            except for NSAID medication. ASA Grade Assessment:                            II - A patient with mild systemic disease. After                            reviewing the risks and benefits, the patient was                            deemed in satisfactory condition to undergo the                            procedure.  After obtaining informed consent, the colonoscope                            was passed under direct vision. Throughout the                            procedure, the patient's blood pressure, pulse, and                            oxygen saturations were monitored continuously. The                            Olympus Scope SN: [id number] was introduced through                            the anus and advanced to the  the cecum, identified                            by appendiceal orifice and ileocecal valve. The                            colonoscopy was performed without difficulty. The                            patient tolerated the procedure. The quality of the                            bowel preparation was adequate. The ileocecal                            valve, appendiceal orifice, and rectum were                            photographed. Scope In: 11:51:53 AM Scope Out: 12:01:52 PM Scope Withdrawal Time: 0 hours 8 minutes 5 seconds  Total Procedure Duration: 0 hours 9 minutes 59 seconds  Findings:                 The digital rectal exam findings include                            hemorrhoids. Pertinent negatives include no                            palpable rectal lesions.                           A 4 mm polyp was found in the ascending colon. The                            polyp was sessile. The polyp was removed with a                            cold snare. Resection and retrieval were complete.  Normal mucosa was found in the entire colon                            otherwise.                           Non-bleeding non-thrombosed internal hemorrhoids                            were found during retroflexion, during perianal                            exam and during digital exam. The hemorrhoids were                            Grade II (internal hemorrhoids that prolapse but                            reduce spontaneously). Complications:            No immediate complications. Estimated Blood Loss:     Estimated blood loss was minimal. Impression:               - Hemorrhoids found on digital rectal exam.                           - One 4 mm polyp in the ascending colon, removed                            with a cold snare. Resected and retrieved.                           - Normal mucosa in the entire examined colon                            otherwise.                            - Non-bleeding non-thrombosed internal hemorrhoids. Recommendation:           - The patient will be observed post-procedure,                            until all discharge criteria are met.                           - Discharge patient to home.                           - Patient has a contact number available for                            emergencies. The signs and symptoms of potential                            delayed complications were discussed with the  patient. Return to normal activities tomorrow.                            Written discharge instructions were provided to the                            patient.                           - High fiber diet.                           - Use FiberCon 1-2 tablets PO daily.                           - Continue present medications.                           - Await pathology results.                           - Repeat colonoscopy in 5-10 years for surveillance                            based on pathology results.                           - The findings and recommendations were discussed                            with the patient.                           - The findings and recommendations were discussed                            with the patient's family. Aloha Finner, MD 10/25/2023 12:11:34 PM

## 2023-10-25 NOTE — Progress Notes (Signed)
 Called to room to assist during endoscopic procedure.  Patient ID and intended procedure confirmed with present staff. Received instructions for my participation in the procedure from the performing physician.

## 2023-10-25 NOTE — Progress Notes (Signed)
 November schedule not out yet. Recall put in for repeat EGD in 3 months to check for healing.

## 2023-10-25 NOTE — Progress Notes (Signed)
 Pt's states no medical or surgical changes since previsit or office visit.

## 2023-10-26 ENCOUNTER — Telehealth: Payer: Self-pay | Admitting: *Deleted

## 2023-10-26 NOTE — Telephone Encounter (Signed)
  Follow up Call-     10/25/2023   10:50 AM  Call back number  Post procedure Call Back phone  # [phone]  Permission to leave phone message Yes   Left message on machine to call back if any questions or concerns

## 2023-10-27 LAB — SURGICAL PATHOLOGY

## 2023-11-17 ENCOUNTER — Other Ambulatory Visit: Payer: Self-pay | Admitting: Gastroenterology

## 2023-11-29 IMAGING — CT CT CHEST LUNG CANCER SCREENING LOW DOSE W/O CM
2 of 5 series · 15 of 40 positions shown, 18 images · non-contrast
Comparison: None.

CLINICAL DATA: 65-year-old asymptomatic male current smoker with
112 pack-year smoking history.

EXAM:
CT CHEST WITHOUT CONTRAST LOW-DOSE FOR LUNG CANCER SCREENING
TECHNIQUE: Multidetector CT imaging of the chest was performed following the
standard protocol without IV contrast.

[Series 5: lung 1.00 br44 cor · coronal · 0.64mm/px · 3 of 343 slices shown]
[im 69/343  lung]
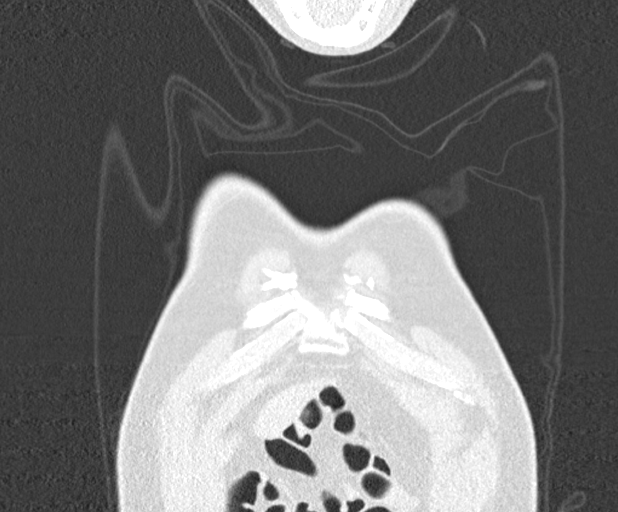
[im 137/343  lung]
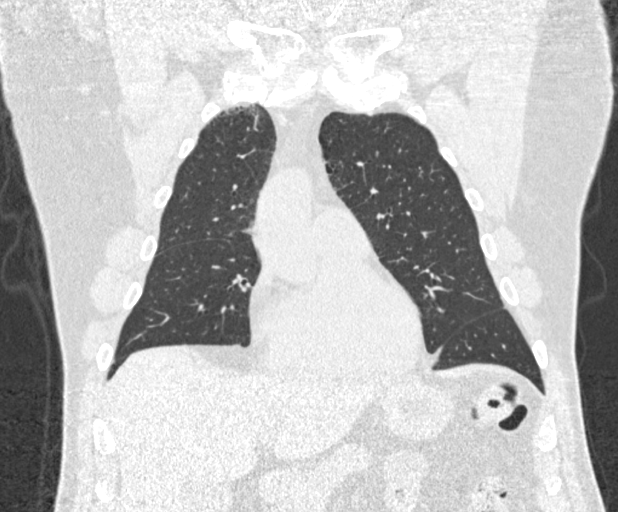
[im 206/343  lung]
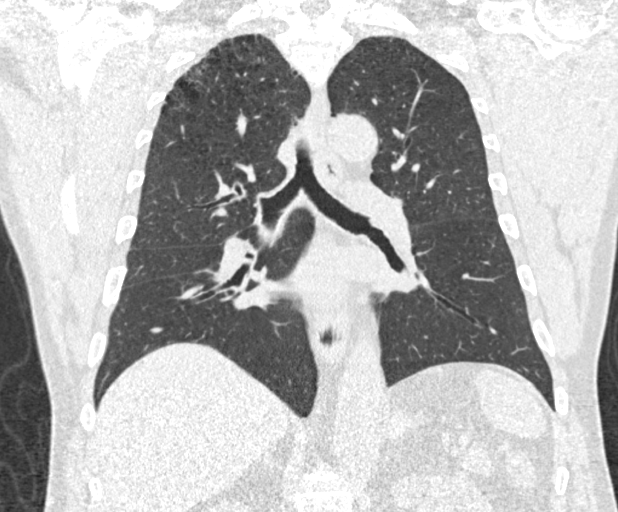

[Series 10: lung 1.00 br60 axial · axial · 0.75mm/px · z∈[-1089,-789]mm · 12 of 330 slices shown, 15 images]
[im 15/330  mediastinal]
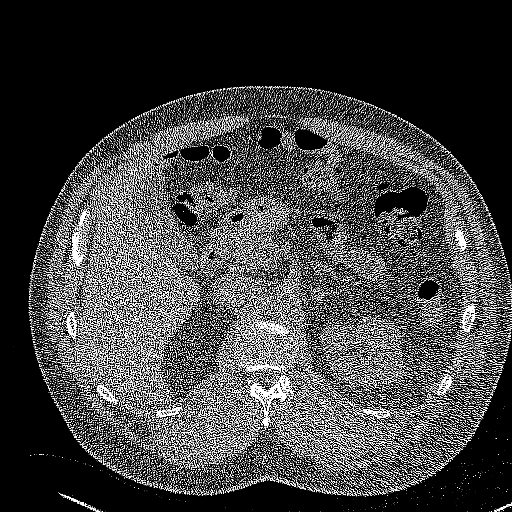
[im 15/330  lung]
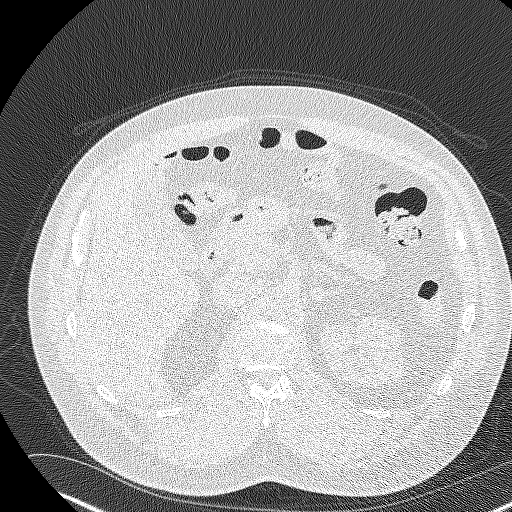
[im 45/330  lung]
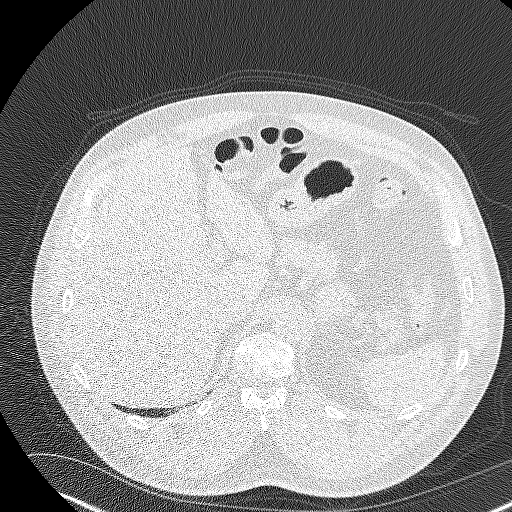
[im 75/330  lung]
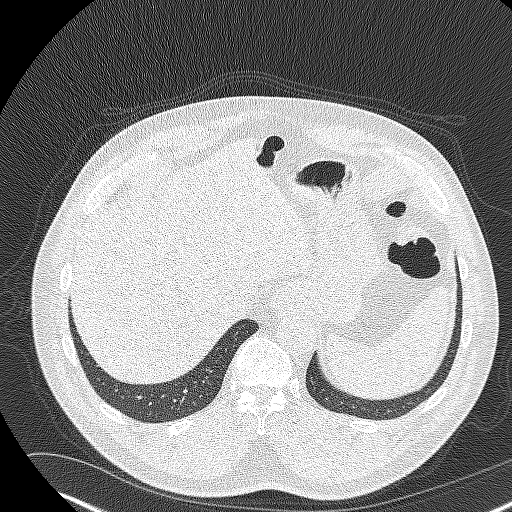
[im 105/330  lung]
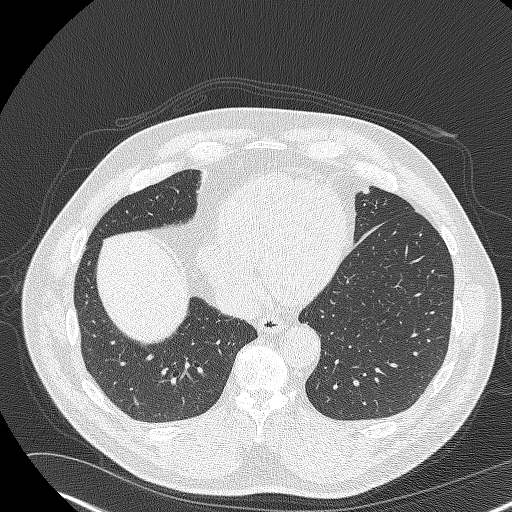
[im 120/330  mediastinal]
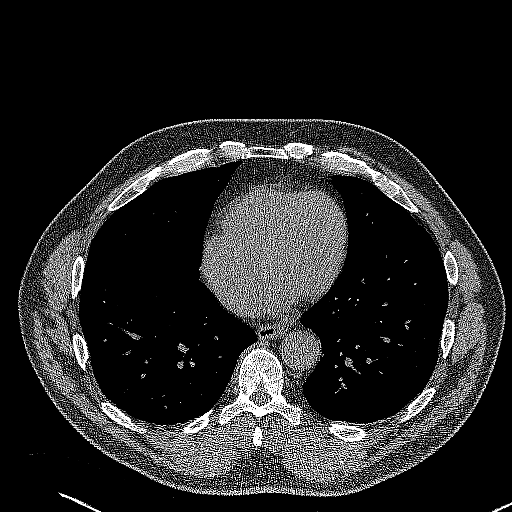
[im 120/330  lung]
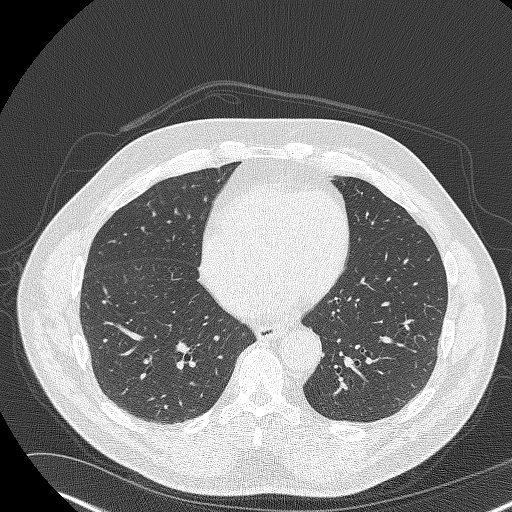
[im 150/330  lung]
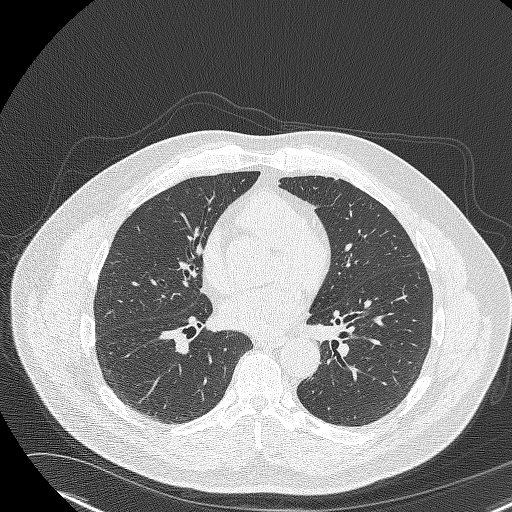
[im 180/330  lung]
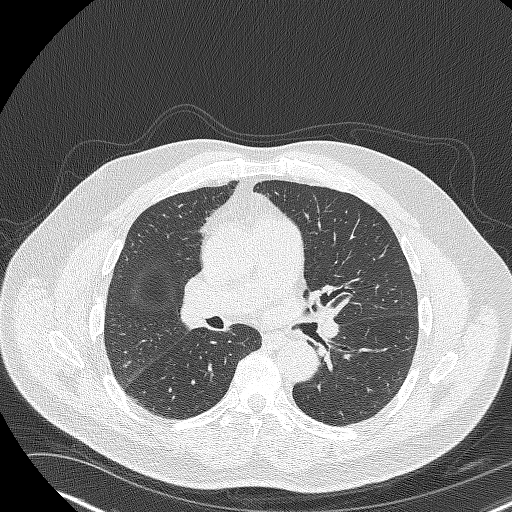
[im 210/330  lung]
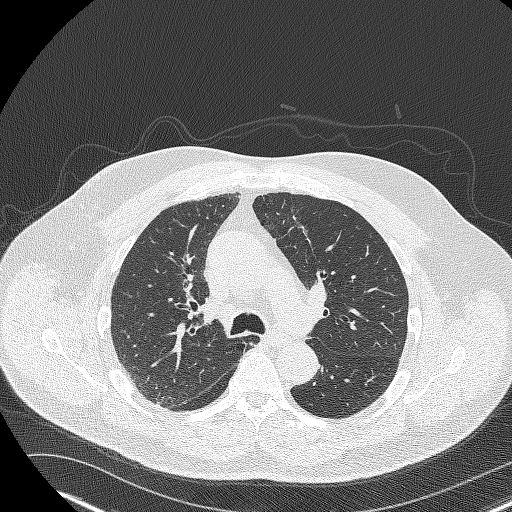
[im 225/330  mediastinal]
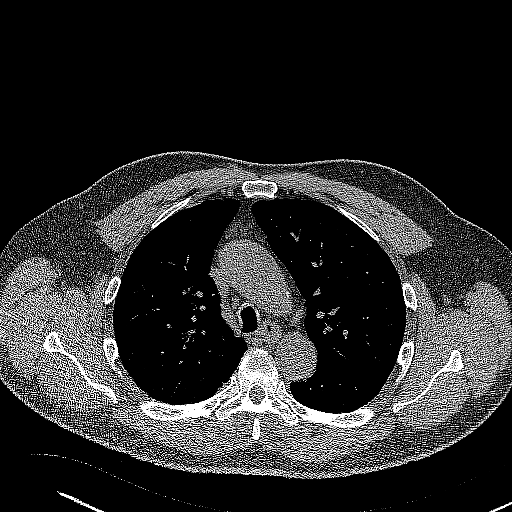
[im 225/330  lung]
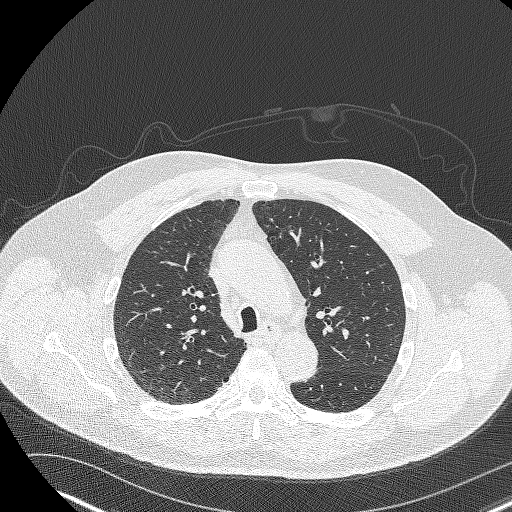
[im 255/330  lung]
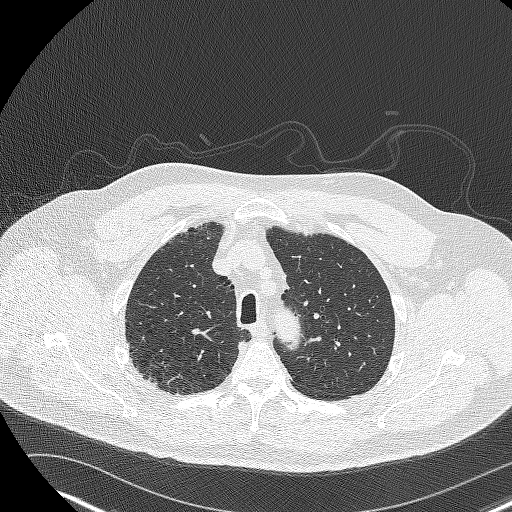
[im 285/330  lung]
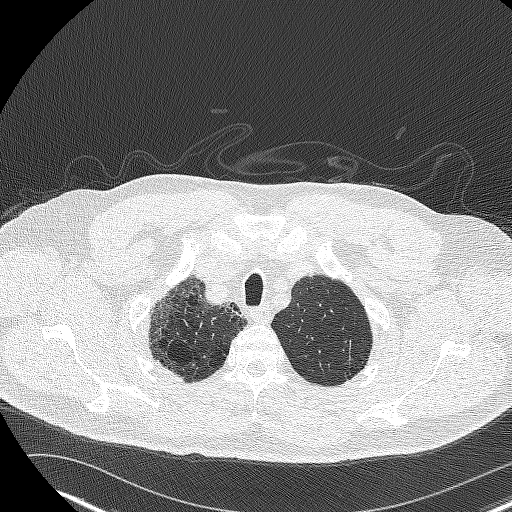
[im 315/330  lung]
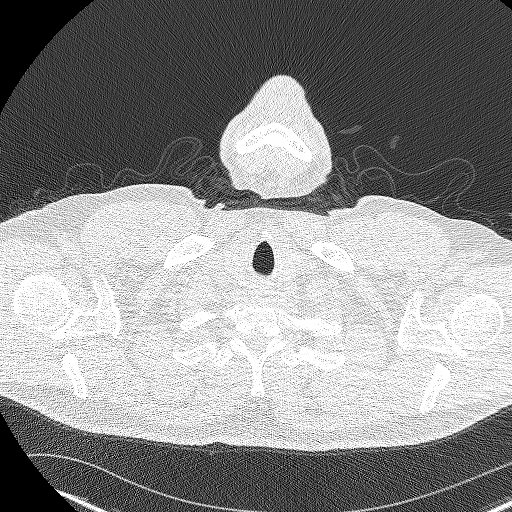

[15 of 40 positions shown; findings below may reference images not displayed]

FINDINGS: Cardiovascular: Normal heart size. No significant pericardial
effusion/thickening. Atherosclerotic thoracic aorta with dilated
cm ascending thoracic aorta. Top-normal caliber main pulmonary
artery (3.4 cm diameter).

Mediastinum/Nodes: No discrete thyroid nodules. Unremarkable
esophagus. No pathologically enlarged axillary, mediastinal or hilar
lymph nodes, noting limited sensitivity for the detection of hilar
adenopathy on this noncontrast study.

Lungs/Pleura: No pneumothorax. No pleural effusion. Moderate
centrilobular emphysema with diffuse bronchial wall thickening. No
acute consolidative airspace disease or lung masses. A few scattered
tiny calcified bilateral pulmonary granulomas. No significant
pulmonary nodules.

Upper abdomen: No acute abnormality.

Musculoskeletal: No aggressive appearing focal osseous lesions. Mild
thoracic spondylosis.
IMPRESSION: Lung-RADS 1, negative. Continue annual screening with low-dose chest
CT without contrast in 12 months.

Dilated 4.2 cm ascending thoracic aorta, which can be reassessed on
follow-up screening chest CT in 12 months.

Aortic Atherosclerosis (XPZEG-ZDO.O) and Emphysema (XPZEG-[id number].Y).

## 2023-12-02 ENCOUNTER — Other Ambulatory Visit: Payer: Self-pay | Admitting: Gastroenterology

## 2023-12-04 NOTE — Telephone Encounter (Signed)
 Endoscopy report said for patient to take for only 1 month

## 2024-03-11 ENCOUNTER — Ambulatory Visit: Payer: Self-pay | Admitting: Internal Medicine

## 2024-03-11 ENCOUNTER — Encounter: Payer: Self-pay | Admitting: Internal Medicine

## 2024-03-11 ENCOUNTER — Ambulatory Visit: Admitting: Internal Medicine

## 2024-03-11 VITALS — BP 132/82 | HR 74 | Temp 98.2°F | Resp 16 | Ht 69.0 in | Wt 231.6 lb

## 2024-03-11 DIAGNOSIS — R9431 Abnormal electrocardiogram [ECG] [EKG]: Secondary | ICD-10-CM

## 2024-03-11 DIAGNOSIS — I10 Essential (primary) hypertension: Secondary | ICD-10-CM | POA: Diagnosis not present

## 2024-03-11 DIAGNOSIS — N401 Enlarged prostate with lower urinary tract symptoms: Secondary | ICD-10-CM

## 2024-03-11 DIAGNOSIS — Z0001 Encounter for general adult medical examination with abnormal findings: Secondary | ICD-10-CM

## 2024-03-11 DIAGNOSIS — E785 Hyperlipidemia, unspecified: Secondary | ICD-10-CM

## 2024-03-11 DIAGNOSIS — F5104 Psychophysiologic insomnia: Secondary | ICD-10-CM

## 2024-03-11 DIAGNOSIS — J418 Mixed simple and mucopurulent chronic bronchitis: Secondary | ICD-10-CM

## 2024-03-11 DIAGNOSIS — Z23 Encounter for immunization: Secondary | ICD-10-CM

## 2024-03-11 DIAGNOSIS — R0609 Other forms of dyspnea: Secondary | ICD-10-CM

## 2024-03-11 DIAGNOSIS — R351 Nocturia: Secondary | ICD-10-CM

## 2024-03-11 DIAGNOSIS — K219 Gastro-esophageal reflux disease without esophagitis: Secondary | ICD-10-CM

## 2024-03-11 DIAGNOSIS — I7121 Aneurysm of the ascending aorta, without rupture: Secondary | ICD-10-CM

## 2024-03-11 LAB — TROPONIN I (HIGH SENSITIVITY): High Sens Troponin I: 3 ng/L (ref 2–17)

## 2024-03-11 LAB — CBC WITH DIFFERENTIAL/PLATELET
Basophils Absolute: 0.1 K/uL (ref 0.0–0.1)
Basophils Relative: 1 % (ref 0.0–3.0)
Eosinophils Absolute: 0.3 K/uL (ref 0.0–0.7)
Eosinophils Relative: 5.4 % — ABNORMAL HIGH (ref 0.0–5.0)
HCT: 40.2 % (ref 39.0–52.0)
Hemoglobin: 13.1 g/dL (ref 13.0–17.0)
Lymphocytes Relative: 53.2 % — ABNORMAL HIGH (ref 12.0–46.0)
Lymphs Abs: 3 K/uL (ref 0.7–4.0)
MCHC: 32.6 g/dL (ref 30.0–36.0)
MCV: 90.6 fl (ref 78.0–100.0)
Monocytes Absolute: 0.6 K/uL (ref 0.1–1.0)
Monocytes Relative: 10.4 % (ref 3.0–12.0)
Neutro Abs: 1.7 K/uL (ref 1.4–7.7)
Neutrophils Relative %: 30 % — ABNORMAL LOW (ref 43.0–77.0)
Platelets: 247 K/uL (ref 150.0–400.0)
RBC: 4.44 Mil/uL (ref 4.22–5.81)
RDW: 13.8 % (ref 11.5–15.5)
WBC: 5.5 K/uL (ref 4.0–10.5)

## 2024-03-11 LAB — LIPID PANEL
Cholesterol: 197 mg/dL (ref 28–200)
HDL: 60.2 mg/dL
LDL Cholesterol: 114 mg/dL — ABNORMAL HIGH (ref 10–99)
NonHDL: 136.48
Total CHOL/HDL Ratio: 3
Triglycerides: 114 mg/dL (ref 10.0–149.0)
VLDL: 22.8 mg/dL (ref 0.0–40.0)

## 2024-03-11 LAB — BASIC METABOLIC PANEL WITH GFR
BUN: 14 mg/dL (ref 6–23)
CO2: 25 meq/L (ref 19–32)
Calcium: 9.2 mg/dL (ref 8.4–10.5)
Chloride: 103 meq/L (ref 96–112)
Creatinine, Ser: 1.19 mg/dL (ref 0.40–1.50)
GFR: 62.81 mL/min
Glucose, Bld: 81 mg/dL (ref 70–99)
Potassium: 3.9 meq/L (ref 3.5–5.1)
Sodium: 139 meq/L (ref 135–145)

## 2024-03-11 LAB — URINALYSIS, ROUTINE W REFLEX MICROSCOPIC
Bilirubin Urine: NEGATIVE
Hgb urine dipstick: NEGATIVE
Ketones, ur: NEGATIVE
Leukocytes,Ua: NEGATIVE
Nitrite: NEGATIVE
RBC / HPF: NONE SEEN
Specific Gravity, Urine: 1.025 (ref 1.000–1.030)
Total Protein, Urine: NEGATIVE
Urine Glucose: NEGATIVE
Urobilinogen, UA: 0.2 (ref 0.0–1.0)
pH: 6 (ref 5.0–8.0)

## 2024-03-11 LAB — TSH: TSH: 1.2 u[IU]/mL (ref 0.35–5.50)

## 2024-03-11 LAB — PSA: PSA: 2.34 ng/mL (ref 0.10–4.00)

## 2024-03-11 LAB — BRAIN NATRIURETIC PEPTIDE: Pro B Natriuretic peptide (BNP): 49 pg/mL (ref 1.0–100.0)

## 2024-03-11 MED ORDER — ROSUVASTATIN CALCIUM 20 MG PO TABS: 20.0000 mg | ORAL_TABLET | Freq: Every day | ORAL | 1 refills | Status: AC

## 2024-03-11 MED ORDER — ASPIRIN 81 MG PO TBEC: 81.0000 mg | DELAYED_RELEASE_TABLET | Freq: Every day | ORAL | 1 refills | Status: AC

## 2024-03-11 MED ORDER — PANTOPRAZOLE SODIUM 40 MG PO TBEC: 40.0000 mg | DELAYED_RELEASE_TABLET | Freq: Every day | ORAL | 1 refills | Status: AC

## 2024-03-11 MED ORDER — SHINGRIX 50 MCG/0.5ML IM SUSR: 0.5000 mL | Freq: Once | INTRAMUSCULAR | 1 refills | Status: AC

## 2024-03-11 MED ORDER — BREZTRI AEROSPHERE 160-9-4.8 MCG/ACT IN AERO: 2.0000 | INHALATION_SPRAY | Freq: Two times a day (BID) | RESPIRATORY_TRACT | 1 refills | Status: DC

## 2024-03-11 MED ORDER — ALBUTEROL SULFATE HFA 108 (90 BASE) MCG/ACT IN AERS: 2.0000 | INHALATION_SPRAY | RESPIRATORY_TRACT | 3 refills | Status: AC | PRN

## 2024-03-11 MED ORDER — TRAZODONE HCL 50 MG PO TABS: 50.0000 mg | ORAL_TABLET | Freq: Every day | ORAL | 0 refills | Status: AC

## 2024-03-11 MED ORDER — COVID-19 MRNA VAC-TRIS(PFIZER) 30 MCG/0.3ML IM SUSY: 0.3000 mL | PREFILLED_SYRINGE | Freq: Once | INTRAMUSCULAR | 0 refills | Status: AC

## 2024-03-11 NOTE — Patient Instructions (Signed)
 Health Maintenance, Male  Adopting a healthy lifestyle and getting preventive care are important in promoting health and wellness. Ask your health care provider about:  The right schedule for you to have regular tests and exams.  Things you can do on your own to prevent diseases and keep yourself healthy.  What should I know about diet, weight, and exercise?  Eat a healthy diet    Eat a diet that includes plenty of vegetables, fruits, low-fat dairy products, and lean protein.  Do not eat a lot of foods that are high in solid fats, added sugars, or sodium.  Maintain a healthy weight  Body mass index (BMI) is a measurement that can be used to identify possible weight problems. It estimates body fat based on height and weight. Your health care provider can help determine your BMI and help you achieve or maintain a healthy weight.  Get regular exercise  Get regular exercise. This is one of the most important things you can do for your health. Most adults should:  Exercise for at least 150 minutes each week. The exercise should increase your heart rate and make you sweat (moderate-intensity exercise).  Do strengthening exercises at least twice a week. This is in addition to the moderate-intensity exercise.  Spend less time sitting. Even light physical activity can be beneficial.  Watch cholesterol and blood lipids  Have your blood tested for lipids and cholesterol at 68 years of age, then have this test every 5 years.  You may need to have your cholesterol levels checked more often if:  Your lipid or cholesterol levels are high.  You are older than 68 years of age.  You are at high risk for heart disease.  What should I know about cancer screening?  Many types of cancers can be detected early and may often be prevented. Depending on your health history and family history, you may need to have cancer screening at various ages. This may include screening for:  Colorectal cancer.  Prostate cancer.  Skin cancer.  Lung  cancer.  What should I know about heart disease, diabetes, and high blood pressure?  Blood pressure and heart disease  High blood pressure causes heart disease and increases the risk of stroke. This is more likely to develop in people who have high blood pressure readings or are overweight.  Talk with your health care provider about your target blood pressure readings.  Have your blood pressure checked:  Every 3-5 years if you are 24-52 years of age.  Every year if you are 3 years old or older.  If you are between the ages of 60 and 72 and are a current or former smoker, ask your health care provider if you should have a one-time screening for abdominal aortic aneurysm (AAA).  Diabetes  Have regular diabetes screenings. This checks your fasting blood sugar level. Have the screening done:  Once every three years after age 66 if you are at a normal weight and have a low risk for diabetes.  More often and at a younger age if you are overweight or have a high risk for diabetes.  What should I know about preventing infection?  Hepatitis B  If you have a higher risk for hepatitis B, you should be screened for this virus. Talk with your health care provider to find out if you are at risk for hepatitis B infection.  Hepatitis C  Blood testing is recommended for:  Everyone born from [birth year] through 1965.  Anyone  with known risk factors for hepatitis C.  Sexually transmitted infections (STIs)  You should be screened each year for STIs, including gonorrhea and chlamydia, if:  You are sexually active and are younger than 68 years of age.  You are older than 68 years of age and your health care provider tells you that you are at risk for this type of infection.  Your sexual activity has changed since you were last screened, and you are at increased risk for chlamydia or gonorrhea. Ask your health care provider if you are at risk.  Ask your health care provider about whether you are at high risk for HIV. Your health care provider  may recommend a prescription medicine to help prevent HIV infection. If you choose to take medicine to prevent HIV, you should first get tested for HIV. You should then be tested every 3 months for as long as you are taking the medicine.  Follow these instructions at home:  Alcohol use  Do not drink alcohol if your health care provider tells you not to drink.  If you drink alcohol:  Limit how much you have to 0-2 drinks a day.  Know how much alcohol is in your drink. In the [place] one drink equals one 12 oz bottle of beer (355 mL), one 5 oz glass of wine (148 mL), or one 1 oz glass of hard liquor (44 mL).  Lifestyle  Do not use any products that contain nicotine or tobacco. These products include cigarettes, chewing tobacco, and vaping devices, such as e-cigarettes. If you need help quitting, ask your health care provider.  Do not use street drugs.  Do not share needles.  Ask your health care provider for help if you need support or information about quitting drugs.  General instructions  Schedule regular health, dental, and eye exams.  Stay current with your vaccines.  Tell your health care provider if:  You often feel depressed.  You have ever been abused or do not feel safe at home.  Summary  Adopting a healthy lifestyle and getting preventive care are important in promoting health and wellness.  Follow your health care provider's instructions about healthy diet, exercising, and getting tested or screened for diseases.  Follow your health care provider's instructions on monitoring your cholesterol and blood pressure.  This information is not intended to replace advice given to you by your health care provider. Make sure you discuss any questions you have with your health care provider.  Document Revised: 07/20/2020 Document Reviewed: 07/20/2020  Elsevier Patient Education  2024 [employer].

## 2024-03-11 NOTE — Progress Notes (Signed)
 "  Subjective:  Patient ID: Nakoa Ganus, male    DOB: 08-Oct-1955  Age: 68 y.o. MRN: [id number]  CC: Annual Exam and COPD   HPI Luvern Mcisaac presents for a CPX and f/up ----  Discussed the use of AI scribe software for clinical note transcription with the patient, who gave verbal consent to proceed.  History of Present Illness Jaydn Moscato is a 68 year old male who presents with worsening shortness of breath and abdominal bloating.  He has experienced worsening shortness of breath and wheezing since March 2025. Despite quitting smoking in January 2025, his breathing difficulties have increased. He experiences shortness of breath with activity and uses his albuterol  inhaler three to four times a day, whereas previously he did not need it as often. He discontinued Anoro and albuterol  initially due to infrequent use but now feels the need to restart Anoro.  He describes abdominal bloating that began in March 2025 during a trip to [facility], accompanied by nausea and a sensation of his stomach 'blowing up' without eating. He recalls being told he had acute gastritis and an ulcer after seeing a gastroenterologist, who performed a colonoscopy. Despite not eating much, he has noticed weight gain and persistent bloating. He occasionally uses Pepto-Bismol and continues to take pantoprazole .  He reports slow urination, which he attributes to a swollen prostate. He is on finasteride and tamsulosin to manage this condition. He denies significant trouble urinating but notes that the flow is slow. He occasionally gets up at night to urinate.  He experiences itching on the front of his legs, particularly on his shins, which he manages with daily hydrocortisone application. He previously had a rash and cysts under his arms, which he attributes to using his wife's deodorant and resolved after switching back to his usual products.     Outpatient Medications Prior to Visit  Medication Sig  Dispense Refill   finasteride (PROSCAR) 5 MG tablet Take 5 mg by mouth daily.     albuterol  (VENTOLIN  HFA) 108 (90 Base) MCG/ACT inhaler TAKE 2 PUFFS BY MOUTH EVERY 6 HOURS AS NEEDED FOR WHEEZE OR SHORTNESS OF BREATH 18 each 3   ibuprofen (ADVIL) 200 MG tablet Take 200 mg by mouth every 6 (six) hours as needed for moderate pain (pain score 4-6).     pantoprazole  (PROTONIX ) 40 MG tablet TAKE 1 TABLET BY MOUTH EVERY DAY 90 tablet 1   umeclidinium-vilanterol (ANORO ELLIPTA ) 62.5-25 MCG/ACT AEPB Inhale 1 puff into the lungs daily at 6 (six) AM. 120 each 1   meloxicam  (MOBIC ) 7.5 MG tablet TAKE 1 TABLET BY MOUTH EVERY DAY 30 tablet 0   pantoprazole  (PROTONIX ) 40 MG tablet Take 1 tablet (40 mg total) by mouth 2 (two) times daily before a meal. 60 tablet 6   rosuvastatin  (CRESTOR ) 10 MG tablet Take 1 tablet (10 mg total) by mouth daily. (Patient not taking: Reported on 03/11/2024) 90 tablet 1   sucralfate  (CARAFATE ) 1 GM/10ML suspension TAKE 10 MLS (1 G TOTAL) BY MOUTH 2 (TWO) TIMES DAILY. 300 mL 1   No facility-administered medications prior to visit.    ROS Review of Systems  Constitutional:  Positive for unexpected weight change (wt gain). Negative for appetite change, chills, diaphoresis, fatigue and fever.  HENT: Negative.  Negative for sore throat and trouble swallowing.   Eyes: Negative.  Negative for visual disturbance.  Respiratory:  Positive for shortness of breath and wheezing.   Cardiovascular:  Negative for chest pain, palpitations and leg swelling.  Gastrointestinal: Negative.  Negative for abdominal pain, constipation, diarrhea, nausea and vomiting.  Endocrine: Negative.   Genitourinary:  Positive for difficulty urinating. Negative for dysuria and hematuria.  Musculoskeletal: Negative.  Negative for arthralgias and myalgias.  Skin:  Positive for rash.  Neurological: Negative.  Negative for dizziness and light-headedness.  Hematological:  Negative for adenopathy. Does not  bruise/bleed easily.  Psychiatric/Behavioral:  Positive for sleep disturbance.     Objective:  BP 132/82 (BP Location: Left Arm, Patient Position: Sitting, Cuff Size: Normal)   Pulse 74   Temp 98.2 F (36.8 C) (Oral)   Resp 16   Ht 5' 9 (1.753 m)   Wt 231 lb 9.6 oz (105.1 kg)   SpO2 96%   BMI 34.20 kg/m   BP Readings from Last 3 Encounters:  03/11/24 132/82  10/25/23 120/72  06/20/23 102/70    Wt Readings from Last 3 Encounters:  03/11/24 231 lb 9.6 oz (105.1 kg)  10/25/23 209 lb (94.8 kg)  06/20/23 204 lb (92.5 kg)    Physical Exam Vitals reviewed.  Constitutional:      Appearance: Normal appearance.  HENT:     Mouth/Throat:     Mouth: Mucous membranes are moist.  Eyes:     General: No scleral icterus.    Conjunctiva/sclera: Conjunctivae normal.  Neck:     Thyroid : No thyroid  mass, thyromegaly or thyroid  tenderness.   Cardiovascular:     Rate and Rhythm: Normal rate and regular rhythm.     Heart sounds: No murmur heard.    No friction rub. No gallop.     Comments: EKG-- NSR, 67 bpm Flat T waves in lateral leads is more prominent No LVH or Q waves  Pulmonary:     Effort: Pulmonary effort is normal.     Breath sounds: No stridor. No wheezing, rhonchi or rales.  Abdominal:     General: Abdomen is protuberant. Bowel sounds are normal. There is no distension.     Palpations: Abdomen is soft. There is no hepatomegaly, splenomegaly or mass.     Tenderness: There is no abdominal tenderness.  Musculoskeletal:        General: Normal range of motion.     Cervical back: Neck supple. No tenderness.     Right lower leg: No edema.     Left lower leg: No edema.  Lymphadenopathy:     Cervical: No cervical adenopathy.  Skin:    General: Skin is warm.     Findings: No rash.  Neurological:     General: No focal deficit present.     Mental Status: He is alert.  Psychiatric:        Mood and Affect: Mood normal.     Lab Results  Component Value Date   WBC 5.5  03/11/2024   HGB 13.1 03/11/2024   HCT 40.2 03/11/2024   PLT 247.0 03/11/2024   GLUCOSE 81 03/11/2024   CHOL 197 03/11/2024   TRIG 114.0 03/11/2024   HDL 60.20 03/11/2024   LDLCALC 114 (H) 03/11/2024   ALT 17 10/25/2023   AST 18 10/25/2023   NA 139 03/11/2024   K 3.9 03/11/2024   CL 103 03/11/2024   CREATININE 1.19 03/11/2024   BUN 14 03/11/2024   CO2 25 03/11/2024   TSH 1.20 03/11/2024   PSA 2.34 03/11/2024    CT ANGIO CHEST AORTA W/CM & OR WO/CM Result Date: 05/23/2023 CLINICAL DATA:  68 year old male with history of ascending thoracic aortic aneurysm follow-up study. EXAM: CT ANGIOGRAPHY  CHEST WITH CONTRAST TECHNIQUE: Multidetector CT imaging of the chest was performed using the standard protocol during bolus administration of intravenous contrast. Multiplanar CT image reconstructions and MIPs were obtained to evaluate the vascular anatomy. RADIATION DOSE REDUCTION: This exam was performed according to the departmental dose-optimization program which includes automated exposure control, adjustment of the mA and/or kV according to patient size and/or use of iterative reconstruction technique. CONTRAST:  Seventy-five mL Isovue  370, intravenous COMPARISON:  05/05/2022 FINDINGS: Cardiovascular: Preferential opacification of the thoracic aorta. No evidence of thoracic aortic aneurysm or dissection. Normal heart size. No pericardial effusion. Sinues of Valsalva: 34 mm 35 x 32 mm ,unchanged Sinotubular Junction: 32 mm ,unchanged Ascending Aorta: 41 mm ,unchanged Aortic Arch: 35 mm ,unchanged Descending aorta: 27 mm at the level of the carina ,unchanged Branch vessels: Conventional branching pattern. No significant atherosclerotic changes. Coronary arteries: Normal origins and courses. No significant atherosclerotic calcifications. Main pulmonary artery: 34 mm ,unchanged. No evidence of central pulmonary embolism. Pulmonary veins: No anomalous pulmonary venous return. No evidence of left atrial  appendage thrombus. Upper abdominal vasculature: Within normal limits. Mediastinum/Nodes: No enlarged mediastinal, hilar, or axillary lymph nodes. Thyroid  gland, trachea, and esophagus demonstrate no significant findings. Lungs/Pleura: Similar appearing severe bilateral upper lobe predominant paraseptal and centrilobular emphysema. Similar diffuse bronchial wall thickening. No focal consolidations. No suspicious pulmonary nodules. No pleural effusion or pneumothorax. Upper Abdomen: The visualized upper abdomen is within normal limits. Musculoskeletal: No acute osseous abnormality. Similar appearance of healed bilateral multifocal rib fractures. Review of the MIP images confirms the above findings. IMPRESSION: Vascular: Similar appearing fusiform ascending thoracic aortic aneurysm measuring up to 4.1 cm. Recommend annual imaging followup by CTA or MRA. This recommendation follows 2010 ACCF/AHA/AATS/ACR/ASA/SCA/SCAI/SIR/STS/SVM Guidelines for the Diagnosis and Management of Patients with Thoracic Aortic Disease. Circulation. 2010; 121: Z733-z630. Aortic aneurysm NOS (ICD10-I71.9) Non-Vascular: Similar appearing upper lobe predominant severe emphysema (ICD10-J43.9). Ester Sides, MD [facility] Electronically Signed   By: Ester Sides M.D.   On: 05/23/2023 09:22   The 10-year ASCVD risk score (Arnett DK, et al., 2019) is: 15%   Values used to calculate the score:     Age: 71 years     Clinically relevant sex: Male     Is Non-Hispanic African American: No     Diabetic: No     Tobacco smoker: No     Systolic Blood Pressure: 132 mmHg     Is BP treated: No     HDL Cholesterol: 60.2 mg/dL     Total Cholesterol: 197 mg/dL   Estimated Creatinine Clearance: 71 mL/min (by C-G formula based on SCr of 1.19 mg/dL).   Assessment & Plan:  Encounter for general adult medical examination with abnormal findings- Exam completed, labs reviewed, vaccines reviewed  and updated, cancer screenings addressed, pt ed material was given.   Dyslipidemia, goal LDL below 100- He has not achieved his LDL goal. -     TSH; Future -     Lipid panel; Future -     Rosuvastatin  Calcium ; Take 1 tablet (20 mg total) by mouth daily.  Dispense: 90 tablet; Refill: 1 -     Aspirin ; Take 1 tablet (81 mg total) by mouth daily. Swallow whole.  Dispense: 90 tablet; Refill: 1  Aneurysm of ascending aorta without rupture  BPH associated with nocturia -     PSA; Future -     Urinalysis, Routine w reflex microscopic; Future  DOE (dyspnea on exertion)- Will evaluate for  ischemia. -     EKG 12-Lead -     Brain natriuretic peptide; Future -     Troponin I (High Sensitivity); Future -     CT CORONARY MORPH W/CTA COR W/SCORE W/CA W/CM &/OR WO/CM; Future  Mixed simple and mucopurulent chronic bronchitis (HCC) -     Breztri  Aerosphere; Inhale 2 puffs into the lungs 2 (two) times daily.  Dispense: 32.1 g; Refill: 1 -     Albuterol  Sulfate HFA; Inhale 2 puffs into the lungs every 4 (four) hours as needed for wheezing or shortness of breath.  Dispense: 18 g; Refill: 3 -     COVID-19 mRNA Vac-TriS(Pfizer); Inject 0.3 mLs into the muscle once for 1 dose.  Dispense: 0.3 mL; Refill: 0  Abnormal electrocardiogram (ECG) (EKG) -     CT CORONARY MORPH W/CTA COR W/SCORE W/CA W/CM &/OR WO/CM; Future  Primary hypertension -     Basic metabolic panel with GFR; Future -     CBC with Differential/Platelet; Future  Psychophysiological insomnia -     traZODone  HCl; Take 1 tablet (50 mg total) by mouth at bedtime.  Dispense: 90 tablet; Refill: 0  Chronic GERD -     Pantoprazole  Sodium; Take 1 tablet (40 mg total) by mouth daily.  Dispense: 90 tablet; Refill: 1  Need for prophylactic vaccination and inoculation against varicella -     Shingrix ; Inject 0.5 mLs into the muscle once for 1 dose.  Dispense: 0.5 mL; Refill: 1     Follow-up: Return in about 3 months (around 06/09/2024).  Debby Molt, MD "

## 2024-03-12 ENCOUNTER — Other Ambulatory Visit: Payer: Self-pay | Admitting: Internal Medicine

## 2024-03-12 DIAGNOSIS — J418 Mixed simple and mucopurulent chronic bronchitis: Secondary | ICD-10-CM

## 2024-03-12 MED ORDER — TRELEGY ELLIPTA 100-62.5-25 MCG/ACT IN AEPB: 1.0000 | INHALATION_SPRAY | Freq: Every day | RESPIRATORY_TRACT | 0 refills | Status: DC

## 2024-03-12 MED ORDER — UMECLIDINIUM-VILANTEROL 62.5-25 MCG/ACT IN AEPB: 1.0000 | INHALATION_SPRAY | Freq: Every day | RESPIRATORY_TRACT | 1 refills | Status: AC

## 2024-03-12 NOTE — Telephone Encounter (Signed)
 Try trelegy

## 2024-03-21 ENCOUNTER — Ambulatory Visit (HOSPITAL_COMMUNITY)
Admission: RE | Admit: 2024-03-21 | Discharge: 2024-03-21 | Disposition: A | Discharge status: Home / Self Care | Source: Ambulatory Visit | Attending: Internal Medicine | Admitting: Internal Medicine

## 2024-03-21 DIAGNOSIS — R0609 Other forms of dyspnea: Secondary | ICD-10-CM | POA: Insufficient documentation

## 2024-03-21 DIAGNOSIS — R9431 Abnormal electrocardiogram [ECG] [EKG]: Secondary | ICD-10-CM | POA: Insufficient documentation

## 2024-03-21 MED ORDER — NITROGLYCERIN 0.4 MG SL SUBL
0.8000 mg | SUBLINGUAL_TABLET | Freq: Once | SUBLINGUAL | Status: AC
  Administered 2024-03-21: 0.8 mg via SUBLINGUAL

## 2024-03-21 MED ORDER — IOHEXOL 350 MG/ML SOLN
100.0000 mL | Freq: Once | INTRAVENOUS | Status: AC | PRN
  Administered 2024-03-21: 100 mL via INTRAVENOUS

## 2024-03-24 ENCOUNTER — Other Ambulatory Visit: Payer: Self-pay | Admitting: Internal Medicine

## 2024-03-24 DIAGNOSIS — I272 Pulmonary hypertension, unspecified: Secondary | ICD-10-CM | POA: Insufficient documentation

## 2024-03-24 DIAGNOSIS — J439 Emphysema, unspecified: Secondary | ICD-10-CM | POA: Insufficient documentation

## 2024-03-29 ENCOUNTER — Telehealth: Payer: Self-pay | Admitting: Internal Medicine

## 2024-03-29 NOTE — Telephone Encounter (Signed)
 Patient has a referral for ophthalmology. He had an appt June of last year but canceled. LMOM to see if he needed help scheduling or if he was seen elsewhere.

## 2024-04-12 ENCOUNTER — Encounter: Payer: Self-pay | Admitting: Acute Care

## 2024-04-30 ENCOUNTER — Ambulatory Visit: Admitting: Pulmonary Disease

## 2024-07-15 ENCOUNTER — Ambulatory Visit

## 2024-07-15 ENCOUNTER — Encounter: Admitting: Internal Medicine
